# Patient Record
Sex: Female | Born: 1965 | Race: Black or African American | Hispanic: No | Marital: Married | Smoking: Never smoker
Health system: Southern US, Community
[De-identification: ages and names within clinical notes are randomized; demographics above are authoritative.]

## PROBLEM LIST (undated history)

## (undated) DIAGNOSIS — I10 Essential (primary) hypertension: Secondary | ICD-10-CM

## (undated) HISTORY — PX: APPENDECTOMY: SHX54

## (undated) HISTORY — PX: ABDOMINAL HYSTERECTOMY: SHX81

## (undated) HISTORY — PX: TUBAL LIGATION: SHX77

## (undated) HISTORY — PX: CHOLECYSTECTOMY: SHX55

---

## 2005-10-10 ENCOUNTER — Ambulatory Visit: Payer: Self-pay | Admitting: Family Medicine

## 2006-08-01 ENCOUNTER — Emergency Department (HOSPITAL_COMMUNITY): Admission: EM | Admit: 2006-08-01 | Discharge: 2006-08-01 | Payer: Self-pay | Admitting: Emergency Medicine

## 2006-08-06 ENCOUNTER — Ambulatory Visit (HOSPITAL_COMMUNITY): Admission: RE | Admit: 2006-08-06 | Discharge: 2006-08-06 | Payer: Self-pay | Admitting: General Surgery

## 2006-08-06 ENCOUNTER — Encounter (INDEPENDENT_AMBULATORY_CARE_PROVIDER_SITE_OTHER): Payer: Self-pay | Admitting: Specialist

## 2007-02-06 ENCOUNTER — Ambulatory Visit (HOSPITAL_COMMUNITY): Admission: RE | Admit: 2007-02-06 | Discharge: 2007-02-06 | Payer: Self-pay | Admitting: Obstetrics and Gynecology

## 2010-12-11 NOTE — Op Note (Signed)
Allison Hobbs, Allison Hobbs            ACCOUNT NO.:  [id number]   MEDICAL RECORD NO.:  [id number]          PATIENT TYPE:  AMB   LOCATION:  DAY                           FACILITY:  [facility]   PHYSICIAN:  Tilda Burrow, M.D. DATE OF BIRTH:  [birth year]   DATE OF PROCEDURE:  02/06/2007  DATE OF DISCHARGE:                               OPERATIVE REPORT   PREOPERATIVE DIAGNOSIS:  1. Elective sterilization.  2. Right lower quadrant abdominal wall scarring status post      appendectomy   POSTOPERATIVE DIAGNOSIS:  1. Elective sterilization.  2. Right lower quadrant abdominal wall scarring status post      appendectomy  3. Right uterine subserosal fibroid.   PROCEDURE:  1. Laparoscopic tubal sterilization with Falope rings.  2. Excision right lower quadrant abdominal scar with two-layer      closure.   SURGEON:  Christin Bach, M.D.   ASSISTANTMarlana Salvage, CST.   ANESTHESIA:  General.   COMPLICATIONS:  None.   FINDINGS:  1. A 3 cm right uterine subserosal fibroid located just in the area      where the round ligament attaches on the right.  2. Normal tubes and ovaries bilaterally.  3. A 4 cm long retracted scar in the right lower quadrant from      childhood appendectomy.   DETAILS OF PROCEDURE:  The patient was taken to the operating room,  prepped and draped for lower abdominal surgery with legs in low  lithotomy support and single-tooth tenaculum attached to the cervix with  in-and-out catheterization of the bladder.  Attention was first directed  to the right lower quadrant incision which was approximately 4 cm in  length and was retracted from old scarring.  A 4 cm long by 1 cm wide  ellipse of skin and fibrosis was excised and beneath it the skin was  elevated and the remaining subcutaneous fibrosis was cut on either side,  getting down to normal appearing fatty tissue on either side.  Bovie  cautery was used sparingly to achieve adequate hemostasis and then a  series of  subcuticular #4-0 Dexon sutures were placed to reapproximate  skin edges.  Some dog-earing at one end of the incision was trimmed with  Metzenbaum scissors.  Hemostasis was good.   Tubal sterilization was then performed and was notable for right uterine  subserosal fibroid 3 cm or so in size.  An infraumbilical vertical 1 cm  skin incision was made, as well as a suprapubic incision of similar  length.  The Veress needle was introduced using water droplet technique  to confirm location, pneumoperitoneum achieved under 7 mmHg pressure,  the procedure then allowed introduction of the laparoscopic trocar under  direct visualization.  A suprapubic 7 mm trocar was placed under direct  visualization.  Attention was directed to the left lower quadrant where  the Falope ring could be identified to the fimbriated end, elevated in a  mid segment knuckle of tube incarcerated in the Falope ring applier.  Photo documentation was performed though photo quality was poor.  The  knuckle of tube was then infiltrated percutaneously with  Marcaine  solution as well as the broad ligament.  The opposite tube was treated  in a similar fashion.  Deflation of the abdomen was followed by  instilling 120 mL of saline in the abdomen to help with evacuation of  intraabdominal carbon dioxide, then the incisions closed at the  umbilical and suprapubic site using subcuticular stitches as well.  Steri-Strips were placed on all 3 incisions and then Op-Site placed over  the right lower quadrant incision.  The patient went to Recovery Room in  good condition.  Sponge and needle counts correct.  EBL minimal.      Tilda Burrow, M.D.  Electronically Signed     JVF/MEDQ  D:  02/06/2007  T:  02/06/2007  Job:  [id number]   cc:   [facility]  [address]  [address]  [place], [place] [address]

## 2010-12-11 NOTE — H&P (Signed)
NAME:  Allison Hobbs, Allison Hobbs            ACCOUNT NO.:  [id number]   MEDICAL RECORD NO.:  [id number]          PATIENT TYPE:  AMB   LOCATION:                                FACILITY:  [facility]   PHYSICIAN:  Tilda Burrow, M.D. DATE OF BIRTH:  Oct 15, 1965   DATE OF ADMISSION:  DATE OF DISCHARGE:  LH                              HISTORY & PHYSICAL   ADMISSION DIAGNOSIS:  Desire for elective permanent sterilization, right  lower quadrant abdominal scar for revision ([place]).   HISTORY OF PRESENT ILLNESS:  This 45 year old female gravida 2, para 2,  status post routine care through the [facility] Department is  admitted at this time for elective permanent sterilization.  She has  been seen in our office and counseled over the permanency procedure with  technical aspects of the procedure reviewed using [employer] instructional  booklet as a visual guide.  The patient understands the permanency with  a failure rate of 1 in 200 quoted to the patient.  Currently on birth  control pills, Ortho-Novum 7/7/7, for three months.   PAST MEDICAL HISTORY:  Positive for Chlamydia in 2007 and last month as  well.  Recently treated.  Not currently sexually active for several  months. Medical history is otherwise negative.   PAST SURGICAL HISTORY:  Gallbladder removal in 2008 by Dr. Lovell Sheehan.  Appendectomy as a child.   INJURIES:  None.   ALLERGIES:  PENICILLIN.   MEDICATIONS:  None.   PHYSICAL EXAMINATION:  VITAL SIGNS:  Height 5 feet 5, weight 179.  HEENT:  Pupils are equal, round and reactive.  Extraocular movements are  intact.  NECK:  Supple.  CHEST:  Clear to auscultation.  ABDOMEN:  Nontender.  PELVIC:  External genitalia:  Multiparous female.  Good vaginal length.  Cervix easily visualized cervix.  The uterus is anteflexed and mobile.  The adnexa is nontender.  EXTREMITIES:  Grossly within normal limits.   PLAN:  1. LTS laparoscopic tubal sterilization Falope rings on Friday, the  11th.  2. 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      Tilda Burrow, M.D.  Electronically Signed     JVF/MEDQ  D:  01/20/2007  T:  01/20/2007  Job:  [id number]

## 2010-12-14 NOTE — Op Note (Signed)
[name], Allison Hobbs            ACCOUNT NO.:  [id number]   MEDICAL RECORD NO.:  [id number]          PATIENT TYPE:  AMB   LOCATION:  DAY                           FACILITY:  [facility]   PHYSICIAN:  Dalia Heading, M.D.  DATE OF BIRTH:  [birth year]   DATE OF PROCEDURE:  08/06/2006  DATE OF DISCHARGE:                               OPERATIVE REPORT   PREOPERATIVE DIAGNOSIS:  Cholecystitis, cholelithiasis.   POSTOPERATIVE DIAGNOSIS:  Cholecystitis, cholelithiasis.   PROCEDURE:  Laparoscopic cholecystectomy.   SURGEON:  Dalia Heading, MD   ANESTHESIA:  General endotracheal.   INDICATIONS:  The patient is a 45 year old black female who presents  with cholecystitis secondary to cholelithiasis.  The risks and benefits  of the procedure including bleeding, infection, hepatobiliary injury,  and the possibly of an open procedure, were fully explained to the  patient, who gave informed consent.   PROCEDURE NOTE:  The patient was placed in the supine position.  After  induction of general endotracheal anesthesia, the abdomen was prepped  and draped using the usual sterile technique with Betadine.  Surgical  site confirmation was performed.   A supraumbilical incision was made down to the fascia.  A Veress needle  was introduced into the abdominal cavity and confirmation of placement  was done using the saline drop test.  The abdomen was then insufflated  to 16 mmHg pressure.  An 11 mm trocar was introduced into the abdominal  cavity under direct visualization without difficulty.  The patient was  placed in reversed Trendelenburg position.  An additional 11-mm trocar  was placed in the epigastric region and 5-mm trocars were placed in the  right upper quadrant and right flank regions.  The liver was inspected  and noted to be within normal limits.  The gallbladder was retracted  superiorly and laterally.  The dissection was begun around the  infundibulum of the gallbladder.  The cystic  duct was first identified.  Its juncture to the infundibulum fully identified.  Endoclips were  placed proximally and distally on the cystic duct and the cystic duct  was divided.  This was likewise done to the cystic artery.  The  gallbladder was then freed away from the gallbladder fossa using Bovie  electrocautery.  The gallbladder was delivered through the epigastric  trocar site using an EndoCatch bag.  The gallbladder fossa was inspected  and no abnormal bleeding or bile leakage was noted.  Surgicel was placed  in the gallbladder fossa.  All fluid and air were then evacuated from  the abdominal cavity prior to removal of the trocars.   All wounds were irrigated with normal saline.  All wounds were injected  with 0.5% Sensorcaine.  The supraumbilical fascia was reapproximated  using an 0 Vicryl interrupted suture.  All skin incisions were closed  using staples.  Betadine ointment and dry sterile dressings were  applied.   All tape and needle counts were correct at the end of the procedure.  The patient was extubated in the operating room and went back to the  recovery room awake, in stable condition.   COMPLICATIONS:  None.   SPECIMEN:  Gallbladder.   BLOOD LOSS:  Minimal.      Dalia Heading, M.D.  Electronically Signed     MAJ/MEDQ  D:  08/06/2006  T:  08/06/2006  Job:  [id number]

## 2010-12-14 NOTE — H&P (Signed)
NAME:  Allison Hobbs, Allison Hobbs            ACCOUNT NO.:  [id number]   MEDICAL RECORD NO.:  [id number]          PATIENT TYPE:  AMB   LOCATION:  DAY                           FACILITY:  [facility]   PHYSICIAN:  Dalia Heading, M.D.  DATE OF BIRTH:  07-29-1966   DATE OF ADMISSION:  DATE OF DISCHARGE:  LH                              HISTORY & PHYSICAL   CHIEF COMPLAINT:  Cholecystitis, cholelithiasis.   HISTORY OF PRESENT ILLNESS:  The patient is a 45 year old black female  who is referred for evaluation and treatment of biliary colic secondary  to cholelithiasis.  She has been having right upper quadrant abdominal  pain, nausea, bloating for over a week.  No resolution has been noted.  She does have fatty food intolerance.  No fever, chills, or jaundice  have been noted.   PAST MEDICAL HISTORY:  Unremarkable.   PAST SURGICAL HISTORY:  Appendectomy in the remote past.   CURRENT MEDICATIONS:  Bactrim for presumed a UTI, Vicodin as needed for  pain.   ALLERGIES:  PENICILLIN.   REVIEW OF SYSTEMS:  Noncontributory.   PHYSICAL EXAMINATION:  GENERAL:  The patient is a well-developed, well-  nourished black female in no acute distress.  HEENT:  Reveals no scleral icterus.  LUNGS:  Clear to auscultation with equal breath sounds bilaterally.  HEART:  Reveals a regular rate and rhythm without S3, S4, or murmurs.  ABDOMEN:  Soft and nondistended.  She is tender in the right upper  quadrant to palpation.  No hepatosplenomegaly, masses, hernias are  identified.   Ultrasound of the gallbladder reveals cholelithiasis with a normal  common bile duct.   IMPRESSION:  1. Cholecystitis.  2. Cholelithiasis.   PLAN:  The patient is scheduled for a laparoscopic cholecystectomy on  August 06, 2006.  The risks and benefits of the procedure including  bleeding, infection, hepatobiliary injury, and the possibly an open  procedure were fully explained to the patient, gave informed consent.      Dalia Heading, M.D.  Electronically Signed     MAJ/MEDQ  D:  08/05/2006  T:  08/05/2006  Job:  [id number]   cc:   [facility]  Fax: [phone]

## 2011-05-14 LAB — CBC
HCT: 34.6 — ABNORMAL LOW
MCHC: 33.6
Platelets: 328
RDW: 13.9
WBC: 7.6

## 2011-05-14 LAB — DIFFERENTIAL
Lymphocytes Relative: 46
Lymphs Abs: 3.5 — ABNORMAL HIGH

## 2011-09-18 ENCOUNTER — Emergency Department: Payer: Self-pay | Admitting: Emergency Medicine

## 2012-09-18 ENCOUNTER — Emergency Department: Payer: Self-pay | Admitting: Emergency Medicine

## 2012-09-18 LAB — COMPREHENSIVE METABOLIC PANEL
Alkaline Phosphatase: 85 U/L (ref 50–136)
Bilirubin,Total: 0.4 mg/dL (ref 0.2–1.0)
Calcium, Total: 8.8 mg/dL (ref 8.5–10.1)
Chloride: 106 mmol/L (ref 98–107)
EGFR (African American): >60
EGFR (Non-African Amer.): >60
Osmolality: 276 (ref 275–301)
Potassium: 4.1 mmol/L (ref 3.5–5.1)
SGOT(AST): 28 U/L (ref 15–37)
Total Protein: 8 g/dL (ref 6.4–8.2)

## 2012-09-18 LAB — CBC
HCT: 25.5 % — ABNORMAL LOW (ref 35.0–47.0)
HGB: 7.5 g/dL — ABNORMAL LOW (ref 12.0–16.0)
MCH: 18.5 pg — ABNORMAL LOW (ref 26.0–34.0)
MCHC: 29.5 g/dL — ABNORMAL LOW (ref 32.0–36.0)
Platelet: 518 10*3/uL — ABNORMAL HIGH (ref 150–440)
RDW: 19.9 % — ABNORMAL HIGH (ref 11.5–14.5)
WBC: 8.3 10*3/uL (ref 3.6–11.0)

## 2012-09-28 ENCOUNTER — Ambulatory Visit: Payer: Self-pay | Admitting: Obstetrics & Gynecology

## 2012-09-28 LAB — PREGNANCY, URINE: Pregnancy Test, Urine: NEGATIVE m[IU]/mL

## 2012-09-28 LAB — CBC
MCH: 18.6 pg — ABNORMAL LOW (ref 26.0–34.0)
MCHC: 28.7 g/dL — ABNORMAL LOW (ref 32.0–36.0)
Platelet: 448 10*3/uL — ABNORMAL HIGH (ref 150–440)
RBC: 4.42 10*6/uL (ref 3.80–5.20)
WBC: 8.1 10*3/uL (ref 3.6–11.0)

## 2012-10-09 ENCOUNTER — Inpatient Hospital Stay: Payer: Self-pay | Admitting: Obstetrics & Gynecology

## 2012-11-10 ENCOUNTER — Ambulatory Visit: Payer: Self-pay

## 2014-01-14 ENCOUNTER — Emergency Department: Payer: Self-pay | Admitting: Emergency Medicine

## 2014-01-14 LAB — BASIC METABOLIC PANEL
Anion Gap: 4 — ABNORMAL LOW (ref 7–16)
BUN: 8 mg/dL (ref 7–18)
CHLORIDE: 105 mmol/L (ref 98–107)
Calcium, Total: 8.8 mg/dL (ref 8.5–10.1)
Co2: 28 mmol/L (ref 21–32)
Creatinine: 0.78 mg/dL (ref 0.60–1.30)
EGFR (African American): >60
EGFR (Non-African Amer.): >60
Glucose: 87 mg/dL (ref 65–99)
Osmolality: 272 (ref 275–301)
Potassium: 3.8 mmol/L (ref 3.5–5.1)
Sodium: 137 mmol/L (ref 136–145)

## 2014-01-14 LAB — CBC
HCT: 35.8 % (ref 35.0–47.0)
HGB: 11.7 g/dL — AB (ref 12.0–16.0)
MCH: 27.6 pg (ref 26.0–34.0)
MCHC: 32.7 g/dL (ref 32.0–36.0)
MCV: 85 fL (ref 80–100)
Platelet: 266 10*3/uL (ref 150–440)
RBC: 4.23 10*6/uL (ref 3.80–5.20)
RDW: 15.6 % — ABNORMAL HIGH (ref 11.5–14.5)
WBC: 6.2 10*3/uL (ref 3.6–11.0)

## 2014-01-14 LAB — TROPONIN I: Troponin-I: <0.02 ng/mL

## 2014-05-25 ENCOUNTER — Ambulatory Visit: Payer: Self-pay | Admitting: Physician Assistant

## 2014-11-01 ENCOUNTER — Ambulatory Visit
Admit: 2014-11-01 | Disposition: A | Discharge status: Home / Self Care | Payer: Self-pay | Admitting: Physician Assistant

## 2014-11-18 NOTE — Op Note (Signed)
PATIENT NAME:  Allison Hobbs, Allison Hobbs MR#:  [id number] DATE OF BIRTH:  10-Feb-1966  DATE OF PROCEDURE:  10/09/2012  PREOPERATIVE DIAGNOSIS: Fibroid uterus and anemia.  POSTOPERATIVE DIAGNOSIS: Fibroid uterus and anemia.   PROCEDURE: Total abdominal hysterectomy.   SURGEON: Annamarie MajorPaul Harris, M.D.   ASSISTANT: Vena AustriaAndreas Staebler, M.D. M.D.   ANESTHESIA: General.   ESTIMATED BLOOD LOSS: 150 mL.   COMPLICATIONS: None.   FINDINGS: Large fibroid uterus and small normal ovaries.   DISPOSITION: To the recovery room in stable condition.   TECHNIQUE: The patient is prepped and draped in the usual sterile fashion after adequate anesthesia is obtained in the supine position on the operating room table. Marcaine 1% is used to infiltrate the skin in the subcuticular space and the subcutaneous space. A midline vertical skin incision is created with a scalpel inferior to the umbilicus down to this suprapubic region. The incision was carried down to the level of the rectus fascia. The rectus fascia was incised and dissected carefully superiorly and inferiorly using Mayo scissors. The muscles are separated in the midline. The peritoneum is penetrated. Uterus is and immediately visualized as fibroid and enlarged in the intra-abdominal cavity.   The right and left round ligaments are carefully clamped, suture ligated and transected. The uterine ovarian blood vessels and ligaments are clamped, transected, and suture ligated with preservation of the ovaries and their main blood supply. Dissection is carried out at this point internally to free up the uterus from its lateral attachments. The uterus is then delivered through the abdominal incision without difficulty. Clamps are applied along the broad ligament complex just off of the uterine wall to the level of the uterine arteries. These are dissected with clamping and suture ligating to minimize bleeding. The ureters are out of harm'Hobbs way during this part of the procedure.  The uterine arteries are then clamped, transected, and suture ligated twice. The uterus is then amputated for ease of visualization. The cervix is grasped with a tenaculum and is further dissected for complete amputation. Clamps are placed along the lateral sides of the cervix down to the level of the external os with transection and suture ligation of these artery pedicles. The cervix is then amputated across the vaginal cuff and the vaginal cuff is closed with interrupted 0 Vicryl sutures until complete closure is performed. Irrigation of the pelvic cavity is performed with aspiration of fluid. No bleeding is noted. No injuries to bladder, bowel or ureters are observed.   The rectus fascia is then closed with a Prolene suture in a running locking fashion. The subcutaneous tissues are irrigated and hemostasis is assured using electrocautery. Skin is closed with surgical clips and a sterile bandage is applied. All sponge, instrument, and needle counts are correct. The patient tolerated procedure well and goes to recovery room in stable condition.   ____________________________ R. Annamarie MajorPaul Harris, MD rph:aw D: 10/09/2012 10:47:32 ET T: 10/09/2012 11:07:06 ET JOB#: [id number]  cc: Dierdre Searles. Paul Harris, MD, <Dictator> Nadara MustardOBERT P HARRIS MD ELECTRONICALLY SIGNED 10/10/2012 9:43

## 2015-06-20 ENCOUNTER — Other Ambulatory Visit: Payer: Self-pay | Admitting: Physician Assistant

## 2015-06-20 DIAGNOSIS — Z1231 Encounter for screening mammogram for malignant neoplasm of breast: Secondary | ICD-10-CM

## 2015-07-06 ENCOUNTER — Ambulatory Visit
Admission: RE | Admit: 2015-07-06 | Discharge: 2015-07-06 | Disposition: A | Discharge status: Home / Self Care | Payer: No Typology Code available for payment source | Source: Ambulatory Visit | Attending: Physician Assistant | Admitting: Physician Assistant

## 2015-07-06 DIAGNOSIS — Z1231 Encounter for screening mammogram for malignant neoplasm of breast: Secondary | ICD-10-CM | POA: Diagnosis present

## 2016-04-26 ENCOUNTER — Other Ambulatory Visit: Payer: Self-pay | Admitting: Pediatrics

## 2016-04-26 DIAGNOSIS — Z1231 Encounter for screening mammogram for malignant neoplasm of breast: Secondary | ICD-10-CM

## 2016-07-09 ENCOUNTER — Ambulatory Visit: Admission: RE | Admit: 2016-07-09 | Payer: No Typology Code available for payment source | Source: Ambulatory Visit

## 2016-07-09 ENCOUNTER — Ambulatory Visit
Admission: RE | Admit: 2016-07-09 | Discharge: 2016-07-09 | Disposition: A | Discharge status: Home / Self Care | Payer: BLUE CROSS/BLUE SHIELD | Source: Ambulatory Visit | Attending: Pediatrics | Admitting: Pediatrics

## 2016-07-09 DIAGNOSIS — Z1231 Encounter for screening mammogram for malignant neoplasm of breast: Secondary | ICD-10-CM | POA: Diagnosis present

## 2016-12-18 ENCOUNTER — Other Ambulatory Visit: Payer: Self-pay | Admitting: Orthopedic Surgery

## 2016-12-18 DIAGNOSIS — G8929 Other chronic pain: Secondary | ICD-10-CM

## 2016-12-18 DIAGNOSIS — M25561 Pain in right knee: Principal | ICD-10-CM

## 2016-12-18 DIAGNOSIS — M2391 Unspecified internal derangement of right knee: Secondary | ICD-10-CM

## 2016-12-27 ENCOUNTER — Ambulatory Visit
Admission: RE | Admit: 2016-12-27 | Discharge: 2016-12-27 | Disposition: A | Discharge status: Home / Self Care | Payer: BLUE CROSS/BLUE SHIELD | Source: Ambulatory Visit | Attending: Orthopedic Surgery | Admitting: Orthopedic Surgery

## 2016-12-27 DIAGNOSIS — M2391 Unspecified internal derangement of right knee: Secondary | ICD-10-CM

## 2016-12-27 DIAGNOSIS — M67863 Other specified disorders of tendon, right knee: Secondary | ICD-10-CM | POA: Insufficient documentation

## 2016-12-27 DIAGNOSIS — G8929 Other chronic pain: Secondary | ICD-10-CM | POA: Diagnosis not present

## 2016-12-27 DIAGNOSIS — M25561 Pain in right knee: Secondary | ICD-10-CM | POA: Diagnosis not present

## 2016-12-31 ENCOUNTER — Encounter: Payer: Self-pay | Admitting: Emergency Medicine

## 2016-12-31 ENCOUNTER — Emergency Department: Payer: BLUE CROSS/BLUE SHIELD

## 2016-12-31 ENCOUNTER — Emergency Department
Admission: EM | Admit: 2016-12-31 | Discharge: 2016-12-31 | Disposition: A | Discharge status: Home / Self Care | Payer: BLUE CROSS/BLUE SHIELD | Attending: Emergency Medicine | Admitting: Emergency Medicine

## 2016-12-31 DIAGNOSIS — R42 Dizziness and giddiness: Secondary | ICD-10-CM | POA: Insufficient documentation

## 2016-12-31 DIAGNOSIS — I10 Essential (primary) hypertension: Secondary | ICD-10-CM | POA: Insufficient documentation

## 2016-12-31 DIAGNOSIS — Z79899 Other long term (current) drug therapy: Secondary | ICD-10-CM | POA: Insufficient documentation

## 2016-12-31 HISTORY — DX: Essential (primary) hypertension: I10

## 2016-12-31 LAB — CBC
HCT: 41.2 % (ref 35.0–47.0)
Hemoglobin: 13.7 g/dL (ref 12.0–16.0)
MCH: 28.8 pg (ref 26.0–34.0)
MCHC: 33.3 g/dL (ref 32.0–36.0)
MCV: 86.4 fL (ref 80.0–100.0)
PLATELETS: 267 10*3/uL (ref 150–440)
RBC: 4.76 MIL/uL (ref 3.80–5.20)
RDW: 14.2 % (ref 11.5–14.5)
WBC: 6.6 10*3/uL (ref 3.6–11.0)

## 2016-12-31 LAB — BASIC METABOLIC PANEL
Anion gap: 7 (ref 5–15)
BUN: 10 mg/dL (ref 6–20)
CALCIUM: 9 mg/dL (ref 8.9–10.3)
CHLORIDE: 106 mmol/L (ref 101–111)
CO2: 27 mmol/L (ref 22–32)
CREATININE: 0.59 mg/dL (ref 0.44–1.00)
GFR calc non Af Amer: >60 mL/min (ref >60)
Glucose, Bld: 102 mg/dL — ABNORMAL HIGH (ref 65–99)
Potassium: 4 mmol/L (ref 3.5–5.1)
SODIUM: 140 mmol/L (ref 135–145)

## 2016-12-31 LAB — URINALYSIS, COMPLETE (UACMP) WITH MICROSCOPIC
BILIRUBIN URINE: NEGATIVE
Bacteria, UA: NONE SEEN
GLUCOSE, UA: NEGATIVE mg/dL
Ketones, ur: NEGATIVE mg/dL
LEUKOCYTES UA: NEGATIVE
Nitrite: NEGATIVE
PH: 7 (ref 5.0–8.0)
Protein, ur: NEGATIVE mg/dL
SPECIFIC GRAVITY, URINE: 1.006 (ref 1.005–1.030)

## 2016-12-31 LAB — TROPONIN I

## 2016-12-31 MED ORDER — ONDANSETRON HCL 4 MG PO TABS: 4.0000 mg | ORAL_TABLET | Freq: Three times a day (TID) | ORAL | 0 refills | Status: DC | PRN

## 2016-12-31 MED ORDER — SODIUM CHLORIDE 0.9 % IV BOLUS (SEPSIS)
1000.0000 mL | Freq: Once | INTRAVENOUS | Status: AC
  Administered 2016-12-31: 1000 mL via INTRAVENOUS

## 2016-12-31 MED ORDER — MECLIZINE HCL 25 MG PO TABS: 25.0000 mg | ORAL_TABLET | Freq: Three times a day (TID) | ORAL | 0 refills | Status: DC | PRN

## 2016-12-31 MED ORDER — IOPAMIDOL (ISOVUE-370) INJECTION 76%
75.0000 mL | Freq: Once | INTRAVENOUS | Status: AC | PRN
  Administered 2016-12-31: 75 mL via INTRAVENOUS

## 2016-12-31 MED ORDER — MECLIZINE HCL 25 MG PO TABS
25.0000 mg | ORAL_TABLET | Freq: Once | ORAL | Status: AC
  Administered 2016-12-31: 25 mg via ORAL
  Filled 2016-12-31: qty 1

## 2016-12-31 NOTE — ED Notes (Signed)
Lab contacted in regards to troponin add on, states they are short staffed this morning, will run troponin now.

## 2016-12-31 NOTE — ED Notes (Signed)
Esign not working, pt verbalized discharge instructions and has no questions at this time 

## 2016-12-31 NOTE — ED Provider Notes (Signed)
[facility] Emergency Department Provider Note   ____________________________________________   First MD Initiated Contact with Patient 12/31/16 [phone]     (approximate)  I have reviewed the triage vital signs and the nursing notes.   HISTORY  Chief Complaint Dizziness    HPI Allison Hobbs is a 51 y.o. female who comes into the hospital today with dizziness. The patient has felt dizzy for the past couple of days. She was getting ready for work today when she sat up on the bed and felt more dizzy and very wobbly. She got a blood pressure might be abscessed at some vinegar. She denies any other illnesses but did have some back pain yesterday. The patient stopped taking her blood pressure meds about a month ago but reports that her doctor was okay with it because the last time they checked her blood pressure was okay. The patient reports that she's been lightheaded and felt wobbly to the left. The patient is been eating and drinking well but stated she had some left arm tingling. The patient denies any chest pain or shortness of breath that she's been very nauseous. The patient decided to come into the hospital today for evaluation of these symptoms.   Past Medical History:  Diagnosis Date  . Hypertension     There are no active problems to display for this patient.   Past Surgical History:  Procedure Laterality Date  . ABDOMINAL HYSTERECTOMY      Prior to Admission medications   Not on File    Allergies Penicillins  Family History  Problem Relation Age of Onset  . Breast cancer Neg Hx     Social History Social History  Substance Use Topics  . Smoking status: Never Smoker  . Smokeless tobacco: Never Used  . Alcohol use Not on file    Review of Systems  Constitutional: No fever/chills Eyes: No visual changes. ENT: No sore throat. Cardiovascular: Denies chest pain. Respiratory: Denies shortness of breath. Gastrointestinal: Nausea  with No abdominal pain.  no vomiting.  No diarrhea.  No constipation. Genitourinary: Negative for dysuria. Musculoskeletal: Negative for back pain. Skin: Negative for rash. Neurological: Dizziness   ____________________________________________   PHYSICAL EXAM:  VITAL SIGNS: ED Triage Vitals  Enc Vitals Group     BP 12/31/16 0538 (!) 159/82     Pulse Rate 12/31/16 0538 62     Resp 12/31/16 0538 18     Temp 12/31/16 0538 98 F (36.7 C)     Temp Source 12/31/16 0538 Oral     SpO2 12/31/16 0538 100 %     Weight 12/31/16 0533 186 lb (84.4 kg)     Height 12/31/16 0533 5\' 5"  (1.651 m)     Head Circumference --      Peak Flow --      Pain Score --      Pain Loc --      Pain Edu? --      Excl. in GC? --     Constitutional: Alert and oriented. Well appearing and in Mild distress. Eyes: Conjunctivae are normal. PERRL. EOMI. Head: Atraumatic. Nose: No congestion/rhinnorhea. Mouth/Throat: Mucous membranes are moist.  Oropharynx non-erythematous. Cardiovascular: Normal rate, regular rhythm. Grossly normal heart sounds.  Good peripheral circulation. Respiratory: Normal respiratory effort.  No retractions. Lungs CTAB. Gastrointestinal: Soft and nontender. No distention. Positive bowel sounds Musculoskeletal: No lower extremity tenderness nor edema.   Neurologic:  Normal speech and language. Cranial nerves II through XII grossly intact  with no focal motor or neuro deficit, finger to nose intact, sensation intact throughout. Skin:  Skin is warm, dry and intact. No rash noted. Psychiatric: Mood and affect are normal.   ____________________________________________   LABS (all labs ordered are listed, but only abnormal results are displayed)  Labs Reviewed  BASIC METABOLIC PANEL - Abnormal; Notable for the following:       Result Value   Glucose, Bld 102 (*)    All other components within normal limits  CBC  URINALYSIS, COMPLETE (UACMP) WITH MICROSCOPIC    ____________________________________________  EKG  ED ECG REPORT I, Rebecka Apley, the attending physician, personally viewed and interpreted this ECG.   Date: 12/31/2016  EKG Time: 543  Rate: 60  Rhythm: sinus rhythm  Axis: normal  Intervals:none  ST&T Change: none  ____________________________________________  RADIOLOGY  No results found.  ____________________________________________   PROCEDURES  Procedure(s) performed: None  Procedures  Critical Care performed: No  ____________________________________________   INITIAL IMPRESSION / ASSESSMENT AND PLAN / ED COURSE  Pertinent labs & imaging results that were available during my care of the patient were reviewed by me and considered in my medical decision making (see chart for details).  This is a 51 year old female who comes into the hospital today with dizziness. The patient will receive a CT scan as well as some blood work. I will give the patient a liter of normal saline and she will have again a CTA of her head and neck. The patient's care was signed out to Dr. Romero Liner follow-up the results of the CT angio and reassess the patient.      ____________________________________________   FINAL CLINICAL IMPRESSION(S) / ED DIAGNOSES  Final diagnoses:  Dizziness      NEW MEDICATIONS STARTED DURING THIS VISIT:  New Prescriptions   No medications on file     Note:  This document was prepared using Dragon voice recognition software and may include unintentional dictation errors.    Rebecka Apley, MD 12/31/16 0730

## 2016-12-31 NOTE — ED Notes (Signed)
Pt back from MRI at this time

## 2016-12-31 NOTE — Discharge Instructions (Addendum)
Please follow up with your primary care physician.

## 2016-12-31 NOTE — ED Notes (Signed)
Patient transported to MRI 

## 2016-12-31 NOTE — ED Provider Notes (Addendum)
-----------------------------------------   10:13 AM on 12/31/2016 -----------------------------------------  Suicidality pending troponin, patient has been feeling lightheaded and "dizzy" for 3 days and she feels like she is leaning to the left. Neurologic exam is normal, Cranial nerves II through XII are grossly intact 5 out of 5 strength bilateral upper and lower extremity. Finger to nose within normal limits heel to shin within normal limits, speech is normal with no word finding difficulty or dysarthria, reflexes symmetric, pupils are equally round and reactive to light, there is no pronator drift, sensation is normal, vision is intact to confrontation, gait is deferred, there is no nystagmus, normal neurologic exam, she is not orthostatic, but she still persists in feeling very "dizzy". This is somewhat difficult to tease out. CT CTA is negative. I will do an MRI to make sure that there is been no posterior circulation CVA causing her symptoms as that is negative is my believe that we can safely get her home according to Dr. Leone PayorWebster's plan.  ----------------------------------------- 12:42 PM on 12/31/2016 -----------------------------------------  MRI is negative, neuro exam remains normal, she has no chest pain she has no shortness of breath, cardiac enzymes are negative despite 3 days of symptoms, she does not have an obvious urinary tract infection, CBC and electrolytes are reassuring, vital signs are reassuring she is not orthostatic to any significant degree, unclear why the patient feels dizzy. We will send her home with Antivert and nausea medication we'll have her closely follow up with her primary care doctor and neurology   Jeanmarie PlantMcShane, James A, MD 12/31/16 1014    Jeanmarie PlantMcShane, James A, MD 12/31/16 1242

## 2016-12-31 NOTE — ED Triage Notes (Signed)
Patient ambulatory to triage with steady gait, without difficulty or distress noted; pt reports awoke with dizziness and nausea; denies any recent illness, denies hx of same; st stopped taking her BP meds due to SE(s) and concerned that her BP may be up

## 2016-12-31 NOTE — ED Notes (Signed)
Patient transported to CT 

## 2017-05-09 ENCOUNTER — Other Ambulatory Visit: Payer: Self-pay | Admitting: Pediatrics

## 2017-05-09 DIAGNOSIS — Z1231 Encounter for screening mammogram for malignant neoplasm of breast: Secondary | ICD-10-CM

## 2017-05-26 ENCOUNTER — Ambulatory Visit
Admission: RE | Admit: 2017-05-26 | Discharge: 2017-05-26 | Disposition: A | Discharge status: Home / Self Care | Payer: BLUE CROSS/BLUE SHIELD | Source: Ambulatory Visit | Attending: Pediatrics | Admitting: Pediatrics

## 2017-05-26 DIAGNOSIS — Z1231 Encounter for screening mammogram for malignant neoplasm of breast: Secondary | ICD-10-CM | POA: Insufficient documentation

## 2018-02-18 ENCOUNTER — Other Ambulatory Visit: Payer: Self-pay | Admitting: Pediatrics

## 2018-02-18 DIAGNOSIS — Z1231 Encounter for screening mammogram for malignant neoplasm of breast: Secondary | ICD-10-CM

## 2018-02-23 ENCOUNTER — Emergency Department: Payer: BLUE CROSS/BLUE SHIELD

## 2018-02-23 ENCOUNTER — Emergency Department
Admission: EM | Admit: 2018-02-23 | Discharge: 2018-02-23 | Disposition: A | Discharge status: Home / Self Care | Payer: BLUE CROSS/BLUE SHIELD | Attending: Emergency Medicine | Admitting: Emergency Medicine

## 2018-02-23 ENCOUNTER — Encounter: Payer: Self-pay | Admitting: Emergency Medicine

## 2018-02-23 DIAGNOSIS — Z79899 Other long term (current) drug therapy: Secondary | ICD-10-CM | POA: Insufficient documentation

## 2018-02-23 DIAGNOSIS — M5431 Sciatica, right side: Secondary | ICD-10-CM | POA: Diagnosis not present

## 2018-02-23 DIAGNOSIS — M25561 Pain in right knee: Secondary | ICD-10-CM | POA: Diagnosis not present

## 2018-02-23 DIAGNOSIS — I10 Essential (primary) hypertension: Secondary | ICD-10-CM | POA: Insufficient documentation

## 2018-02-23 DIAGNOSIS — R200 Anesthesia of skin: Secondary | ICD-10-CM | POA: Diagnosis present

## 2018-02-23 MED ORDER — METHYLPREDNISOLONE 4 MG PO TBPK: ORAL_TABLET | ORAL | 0 refills | Status: DC

## 2018-02-23 MED ORDER — CYCLOBENZAPRINE HCL 10 MG PO TABS: 10.0000 mg | ORAL_TABLET | Freq: Three times a day (TID) | ORAL | 0 refills | Status: DC | PRN

## 2018-02-23 NOTE — ED Triage Notes (Signed)
Patient presents to the ED with numbness down her right leg x 2 months.  Patient is also complaining of lower back pain and knee pain that she has had for years.  Patient states she was diagnosed with bulging discs in 2013.  Patient states, "I was told it would probably get better, but it's getting worse."  Patient is in no obvious distress at this time.  Patient states she had an MRI of her right knee last year that did not show "anything."

## 2018-02-23 NOTE — ED Notes (Signed)
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

## 2018-02-23 NOTE — ED Provider Notes (Signed)
[facility] Emergency Department Provider Note   ____________________________________________   First MD Initiated Contact with Patient 02/23/18 1040     (approximate)  I have reviewed the triage vital signs and the nursing notes.   HISTORY  Chief Complaint Leg Pain    HPI Allison Hobbs is a 52 y.o. female patient complain of radicular problems to her right lower extremity for 2 months.  Patient has not discussed this with her PCP.  Patient is concerned because she was diagnosed with bulging disc in 2018.  Patient also complained of right knee pain.  Patient had an MRI of the right knee last year was unremarkable.  Patient states she was seen by orthopedics last year for her knee and was told her that her back should get better.  Patient has not discussed this with treating orthopedics.  Patient stated radicular component "numbness" increases with prolonged standing or laying down.  Patient denies bladder bowel dysfunction.  Patient rates her pain discomfort as 8/10.  Patient's  chief complaint is "numbness".  No palliative measures for complaint.  Past Medical History:  Diagnosis Date  . Hypertension     There are no active problems to display for this patient.   Past Surgical History:  Procedure Laterality Date  . ABDOMINAL HYSTERECTOMY      Prior to Admission medications   Medication Sig Start Date End Date Taking? Authorizing Provider  hydrochlorothiazide (HYDRODIURIL) 25 MG tablet Take 25 mg by mouth daily.   Yes [provider]  cloNIDine (CATAPRES) 0.1 MG tablet Take 0.1 mg by mouth 2 (two) times daily. 09/13/16 09/13/17  [provider]  cyclobenzaprine (FLEXERIL) 10 MG tablet Take 1 tablet (10 mg total) by mouth 3 (three) times daily as needed. 02/23/18   Joni ReiningSmith, Ronald K, PA-C  Diclofenac Sodium CR 100 MG 24 hr tablet Take 100 mg by mouth daily. 12/24/16   [provider]  meclizine (ANTIVERT) 25 MG tablet Take 1  tablet (25 mg total) by mouth 3 (three) times daily as needed. 12/31/16   Jeanmarie PlantMcShane, James A, MD  methylPREDNISolone (MEDROL DOSEPAK) 4 MG TBPK tablet Take Tapered dose as directed 02/23/18   Joni ReiningSmith, Ronald K, PA-C  ondansetron (ZOFRAN) 4 MG tablet Take 1 tablet (4 mg total) by mouth every 8 (eight) hours as needed for nausea or vomiting. 12/31/16   Jeanmarie PlantMcShane, James A, MD  tiZANidine (ZANAFLEX) 4 MG tablet Take 4 mg by mouth at bedtime as needed. 12/10/16   [provider]    Allergies Penicillins  Family History  Problem Relation Age of Onset  . Breast cancer Neg Hx     Social History Social History   Tobacco Use  . Smoking status: Never Smoker  . Smokeless tobacco: Never Used  Substance Use Topics  . Alcohol use: Not on file  . Drug use: Not on file    Review of Systems Constitutional: No fever/chills Eyes: No visual changes. ENT: No sore throat. Cardiovascular: Denies chest pain. Respiratory: Denies shortness of breath. Gastrointestinal: No abdominal pain.  No nausea, no vomiting.  No diarrhea.  No constipation. Genitourinary: Negative for dysuria. Musculoskeletal: Negative for back pain. Skin: Negative for rash. Neurological: Negative for headaches, focal weakness or numbness. Allergic/Immunilogical: Penicillin ____________________________________________   PHYSICAL EXAM:  VITAL SIGNS: ED Triage Vitals  Enc Vitals Group     BP 02/23/18 1018 120/85     Pulse Rate 02/23/18 1018 81     Resp 02/23/18 1018 18  Temp 02/23/18 1018 98.7 F (37.1 C)     Temp Source 02/23/18 1018 Oral     SpO2 02/23/18 1018 97 %     Weight 02/23/18 1019 186 lb (84.4 kg)     Height 02/23/18 1019 5\' 5"  (1.651 m)     Head Circumference --      Peak Flow --      Pain Score 02/23/18 1018 8     Pain Loc --      Pain Edu? --      Excl. in GC? --    Constitutional: Alert and oriented. Well appearing and in no acute distress. Cardiovascular: Normal rate, regular rhythm. Grossly normal  heart sounds.  Good peripheral circulation. Respiratory: Normal respiratory effort.  No retractions. Lungs CTAB. Gastrointestinal: Soft and nontender. No distention. No abdominal bruits. No CVA tenderness. Musculoskeletal: No obvious spinal deformity.  Patient has Neurologic:  Normal speech and language. No gross focal neurologic deficits are appreciated. No gait instability. Skin:  Skin is warm, dry and intact. No rash noted. Psychiatric: Mood and affect are normal. Speech and behavior are normal.  ____________________________________________   LABS (all labs ordered are listed, but only abnormal results are displayed)  Labs Reviewed - No data to display ____________________________________________  EKG   ____________________________________________  RADIOLOGY  ED MD interpretation:    Official radiology report(s): Dg Lumbar Spine Complete  Result Date: 02/23/2018 CLINICAL DATA:  52 year old female with lower back pain for 6 years. Progressively worse. No injury. Initial encounter. EXAM: LUMBAR SPINE - COMPLETE 4+ VIEW COMPARISON:  None. FINDINGS: Moderate L5-S1 disc space narrowing. Minimal anterior slip L5 secondary to facet degenerative changes. No pars defect noted. Mild L3-4 and L4-5 disc space narrowing. No compression fracture. Sacroiliac joints intact. IMPRESSION: 1. Moderate L5-S1 disc space narrowing. Minimal anterior slip L5 secondary to facet degenerative changes. 2. Mild L3-4 and L4-5 disc space narrowing. Electronically Signed   By: Lacy Duverney M.D.   On: 02/23/2018 11:11    ____________________________________________   PROCEDURES  Procedure(s) performed: None  Procedures  Critical Care performed: No  ____________________________________________   INITIAL IMPRESSION / ASSESSMENT AND PLAN / ED COURSE  As part of my medical decision making, I reviewed the following data within the electronic MEDICAL RECORD NUMBER    Right leg numbness and right knee pain.   Discussed x-ray findings with patient showing disc space narrowing team and arthritic changes.  Patient given discharge care instruction advised follow orthopedic for definitive evaluation and treatment.      ____________________________________________   FINAL CLINICAL IMPRESSION(S) / ED DIAGNOSES  Final diagnoses:  Sciatica of right side  Acute pain of right knee     ED Discharge Orders        Ordered    methylPREDNISolone (MEDROL DOSEPAK) 4 MG TBPK tablet     02/23/18 1129    cyclobenzaprine (FLEXERIL) 10 MG tablet  3 times daily PRN     02/23/18 1129       Note:  This document was prepared using Dragon voice recognition software and may include unintentional dictation errors.    Joni Reining, PA-C 02/23/18 1134    Schaevitz, Myra Rude, MD 02/23/18 1524

## 2018-02-23 NOTE — Discharge Instructions (Signed)
Take medication as directed and follow orthopedic for definitive evaluation and treatment.

## 2018-05-10 ENCOUNTER — Emergency Department: Payer: BLUE CROSS/BLUE SHIELD

## 2018-05-10 ENCOUNTER — Other Ambulatory Visit: Payer: Self-pay

## 2018-05-10 ENCOUNTER — Emergency Department
Admission: EM | Admit: 2018-05-10 | Discharge: 2018-05-10 | Discharge status: Against Medical Advice | Payer: BLUE CROSS/BLUE SHIELD | Attending: Emergency Medicine | Admitting: Emergency Medicine

## 2018-05-10 DIAGNOSIS — Z5320 Procedure and treatment not carried out because of patient's decision for unspecified reasons: Secondary | ICD-10-CM | POA: Insufficient documentation

## 2018-05-10 DIAGNOSIS — I10 Essential (primary) hypertension: Secondary | ICD-10-CM | POA: Diagnosis not present

## 2018-05-10 DIAGNOSIS — M25521 Pain in right elbow: Secondary | ICD-10-CM | POA: Insufficient documentation

## 2018-05-10 DIAGNOSIS — Z79899 Other long term (current) drug therapy: Secondary | ICD-10-CM | POA: Insufficient documentation

## 2018-05-10 NOTE — ED Provider Notes (Signed)
[facility] Emergency Department Provider Note  ____________________________________________  Time seen: Approximately 3:25 PM  I have reviewed the triage vital signs and the nursing notes.   HISTORY  Chief Complaint Elbow Pain    HPI Allison Hobbs is a 52 y.o. female presents to the emergency department with lateral elbow pain after patient reports that she fell 2 to 3 weeks ago.  Patient reports that she fell on an outstretched hand and thinks that something collided against the lateral aspect of her forearm.  Patient has reproducible pain when she tries to extend the wrist or make a fist.  Pain is improved with a resting position.  No numbness or tingling in the right upper extremity.  No subjective weakness.  No neck pain.  No alleviating measures of been attempted.   Past Medical History:  Diagnosis Date  . Hypertension     There are no active problems to display for this patient.   Past Surgical History:  Procedure Laterality Date  . ABDOMINAL HYSTERECTOMY    . APPENDECTOMY    . CHOLECYSTECTOMY    . TUBAL LIGATION      Prior to Admission medications   Medication Sig Start Date End Date Taking? Authorizing Provider  cloNIDine (CATAPRES) 0.1 MG tablet Take 0.1 mg by mouth 2 (two) times daily. 09/13/16 09/13/17  [provider]  cyclobenzaprine (FLEXERIL) 10 MG tablet Take 1 tablet (10 mg total) by mouth 3 (three) times daily as needed. 02/23/18   Joni Reining, PA-C  Diclofenac Sodium CR 100 MG 24 hr tablet Take 100 mg by mouth daily. 12/24/16   [provider]  hydrochlorothiazide (HYDRODIURIL) 25 MG tablet Take 25 mg by mouth daily.    [provider]  meclizine (ANTIVERT) 25 MG tablet Take 1 tablet (25 mg total) by mouth 3 (three) times daily as needed. 12/31/16   Jeanmarie Plant, MD  methylPREDNISolone (MEDROL DOSEPAK) 4 MG TBPK tablet Take Tapered dose as directed 02/23/18   Joni Reining, PA-C  ondansetron  (ZOFRAN) 4 MG tablet Take 1 tablet (4 mg total) by mouth every 8 (eight) hours as needed for nausea or vomiting. 12/31/16   Jeanmarie Plant, MD  tiZANidine (ZANAFLEX) 4 MG tablet Take 4 mg by mouth at bedtime as needed. 12/10/16   [provider]    Allergies Penicillins  Family History  Problem Relation Age of Onset  . Breast cancer Neg Hx     Social History Social History   Tobacco Use  . Smoking status: Never Smoker  . Smokeless tobacco: Never Used  Substance Use Topics  . Alcohol use: Yes  . Drug use: Not on file     Review of Systems  Constitutional: No fever/chills Eyes: No visual changes. No discharge ENT: No upper respiratory complaints. Cardiovascular: no chest pain. Respiratory: no cough. No SOB. Gastrointestinal: No abdominal pain.  No nausea, no vomiting.  No diarrhea.  No constipation. Genitourinary: Negative for dysuria. No hematuria Musculoskeletal: Patient has right elbow pain.  Skin: Negative for rash, abrasions, lacerations, ecchymosis. Neurological: Negative for headaches, focal weakness or numbness.  ____________________________________________   PHYSICAL EXAM:  VITAL SIGNS: ED Triage Vitals [05/10/18 1411]  Enc Vitals Group     BP 118/71     Pulse Rate 87     Resp 17     Temp 98.9 F (37.2 C)     Temp Source Oral     SpO2 100 %     Weight 187  lb (84.8 kg)     Height 5\' 5"  (1.651 m)     Head Circumference      Peak Flow      Pain Score 8     Pain Loc      Pain Edu?      Excl. in GC?      Constitutional: Alert and oriented. Well appearing and in no acute distress. Eyes: Conjunctivae are normal. PERRL. EOMI. Head: Atraumatic. Cardiovascular: Normal rate, regular rhythm. Normal S1 and S2.  Good peripheral circulation. Respiratory: Normal respiratory effort without tachypnea or retractions. Lungs CTAB. Good air entry to the bases with no decreased or absent breath sounds. Musculoskeletal: Patient has 5 out of 5 strength in the  upper extremities bilaterally.  Patient has reproducible pain with resisted flexion at the right wrist.  Pain is reproduced over the lateral epicondyle.  Negative Tinel and Phalen's.  Negative Finkelstein test.  Palpable radial pulse bilaterally and symmetrically. Neurologic:  Normal speech and language. No gross focal neurologic deficits are appreciated.  Skin:  Skin is warm, dry and intact. No rash noted. Psychiatric: Mood and affect are normal. Speech and behavior are normal. Patient exhibits appropriate insight and judgement.   ____________________________________________   LABS (all labs ordered are listed, but only abnormal results are displayed)  Labs Reviewed - No data to display ____________________________________________  EKG   ____________________________________________  RADIOLOGY    No results found.  ____________________________________________    PROCEDURES  Procedure(s) performed:    Procedures    Medications - No data to display   ____________________________________________   INITIAL IMPRESSION / ASSESSMENT AND PLAN / ED COURSE  Pertinent labs & imaging results that were available during my care of the patient were reviewed by me and considered in my medical decision making (see chart for details).  Review of the [place] CSRS was performed in accordance of the [facility] prior to dispensing any controlled drugs.      Assessment and plan:  Right elbow pain:  Patient left emergency department after being seen and evaluated by me but before x-rays were obtained.  Nursing notified me that patient left emergency department.   ____________________________________________  FINAL CLINICAL IMPRESSION(S) / ED DIAGNOSES  Final diagnoses:  Right elbow pain      NEW MEDICATIONS STARTED DURING THIS VISIT:  ED Discharge Orders    None          This chart was dictated using voice recognition software/Dragon. Despite best efforts to proofread,  errors can occur which can change the meaning. Any change was purely unintentional.    Orvil Feil, PA-C 05/10/18 1903    Jeanmarie Plant, MD 05/10/18 2113

## 2018-05-10 NOTE — ED Notes (Signed)
Pt presents to ED with c/c of R elbow pain. Pt states that she slipped at the bottom of her stairs and her elbow impacted an interior doorframe as she reached out to catch herself. States that there was no pain initially but it has increased gradually over the last two weeks. Pt has taken OTC ibuprofen and applied a brace but reports no relief. Last dose of ibuprofen was 2 days ago. Pt indicates pain localized to lateral aspect of elbow, non-radiating, demonstrated active RoM to approx 90 degrees, limited by pain. Distal pulses intact, cap refill <3 sec.

## 2018-05-10 NOTE — ED Triage Notes (Signed)
Pt states she slipped and fell two weeks ago and has been having right elbow pain since.Marland Kitchen

## 2018-05-10 NOTE — ED Notes (Signed)
Radiology tech stated that this patient declined to go to radiology and left without being seen. Pts room was empty when this nurse went to check.

## 2018-05-27 ENCOUNTER — Ambulatory Visit
Admission: RE | Admit: 2018-05-27 | Discharge: 2018-05-27 | Disposition: A | Discharge status: Home / Self Care | Payer: BLUE CROSS/BLUE SHIELD | Source: Ambulatory Visit | Attending: Pediatrics | Admitting: Pediatrics

## 2018-05-27 DIAGNOSIS — Z1231 Encounter for screening mammogram for malignant neoplasm of breast: Secondary | ICD-10-CM | POA: Diagnosis present

## 2018-11-01 IMAGING — MR MR KNEE*R* W/O CM
5 series · 40 of 40 positions shown · non-contrast
Comparison: Plain films right knee 11/01/2014.

CLINICAL DATA: Chronic right knee pain and swelling. No known
injury.

EXAM:
MRI OF THE RIGHT KNEE WITHOUT CONTRAST
TECHNIQUE: Multiplanar, multisequence MR imaging of the knee was performed. No
intravenous contrast was administered.

[Series 5: PD fat-sat · axial · 3.0mm · 0.33mm/px · z∈[-55,+57]mm · 8 of 35 slices shown (1 of 3)]
[im 1/35]
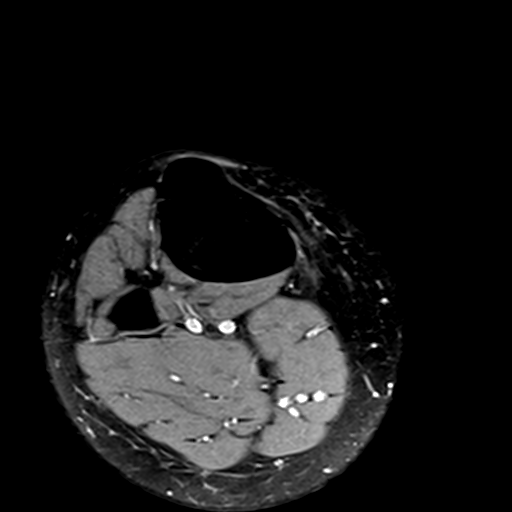
[im 5/35]
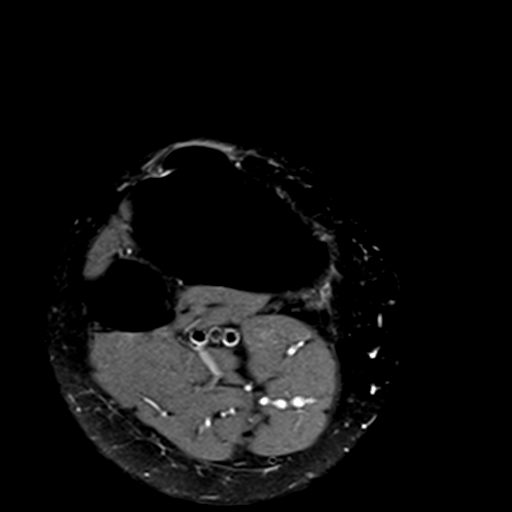
[im 10/35]
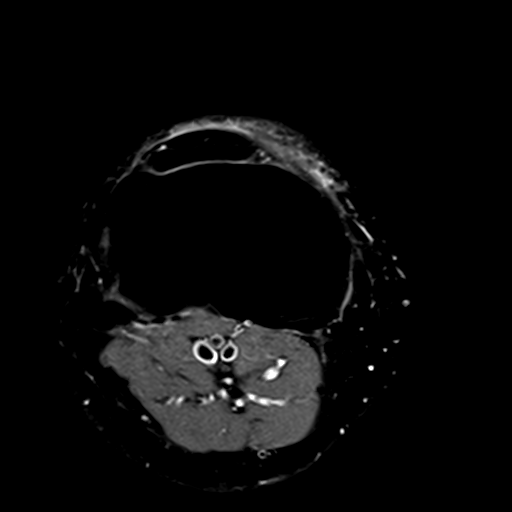
[im 15/35]
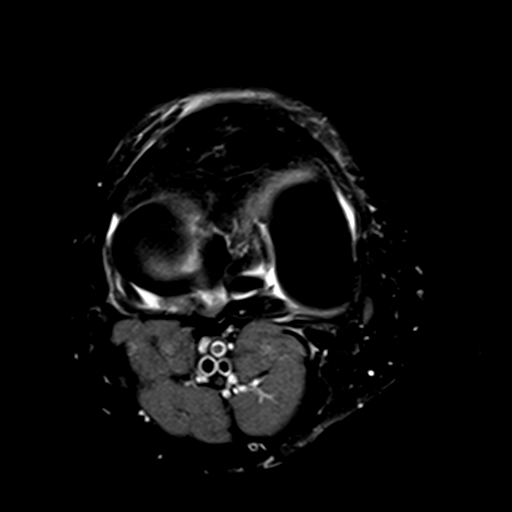
[im 20/35]
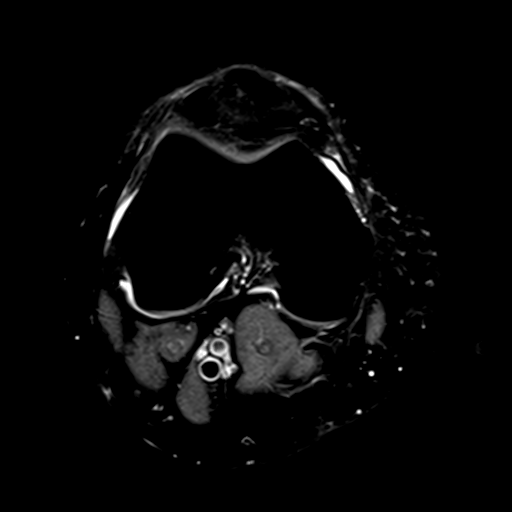
[im 25/35]
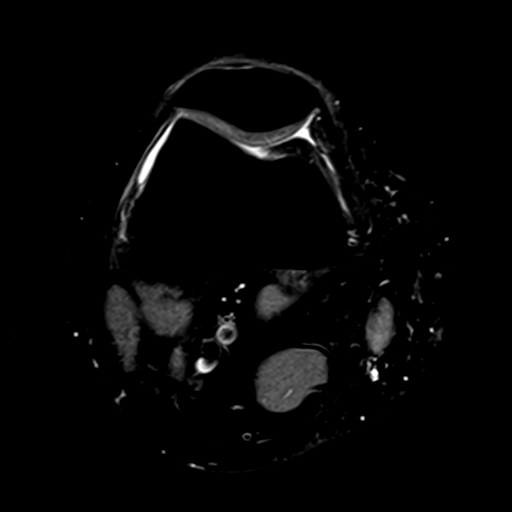
[im 30/35]
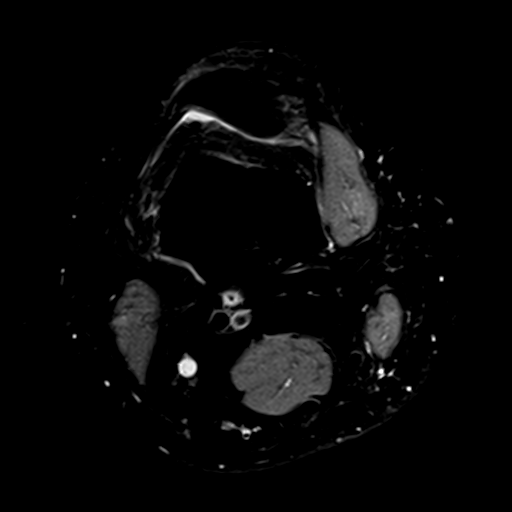
[im 35/35]
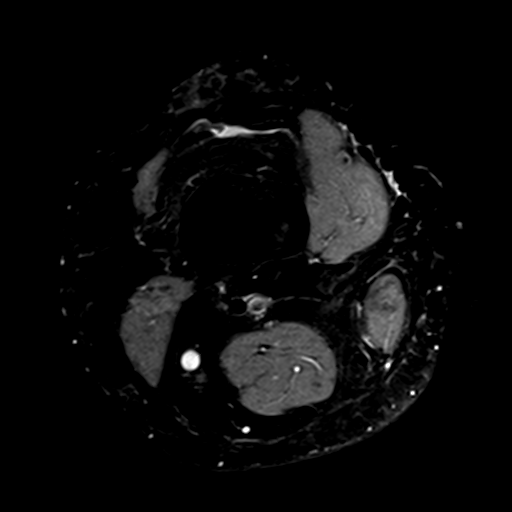

[Series 6: T1 · coronal · 3.0mm · 0.50mm/px · 8 of 31 slices shown]
[im 1/31]
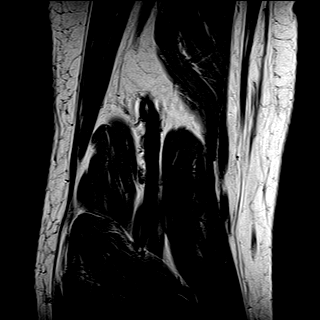
[im 5/31]
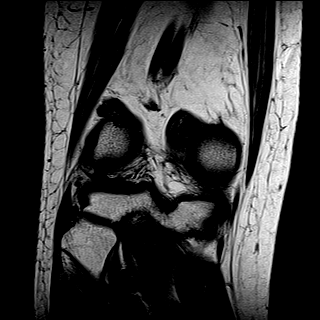
[im 9/31]
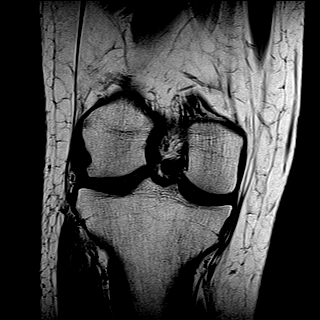
[im 13/31]
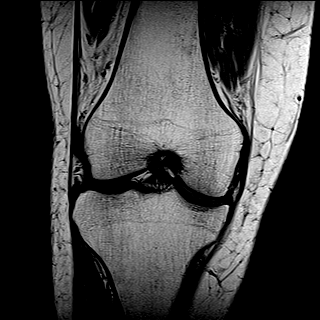
[im 18/31]
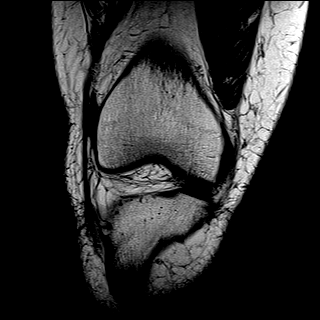
[im 22/31]
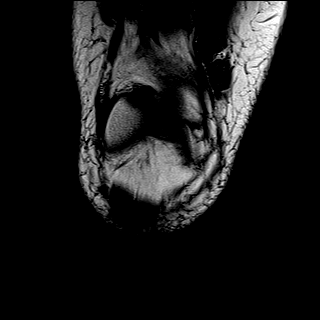
[im 26/31]
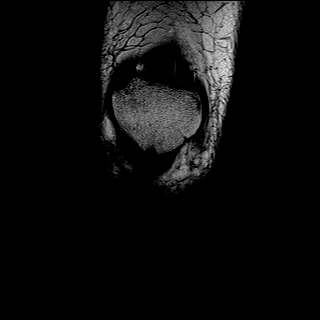
[im 31/31]
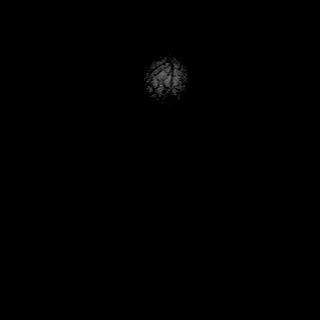

[Series 7: T2 fat-sat · coronal · 3.0mm · 0.50mm/px · 8 of 31 slices shown]
[im 1/31]
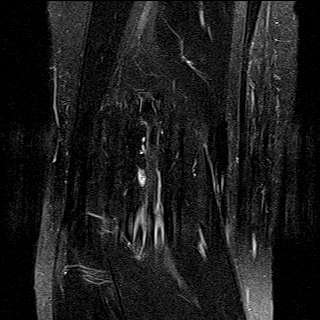
[im 5/31]
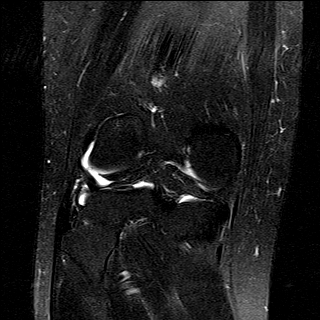
[im 9/31]
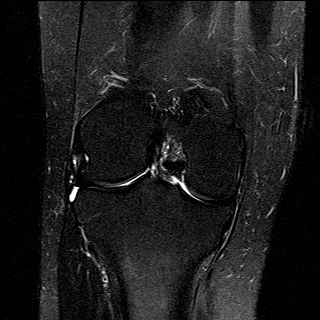
[im 13/31]
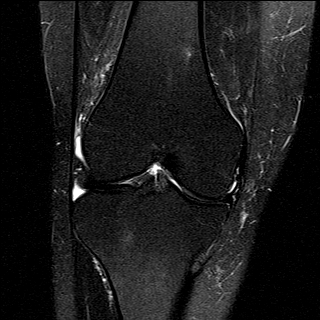
[im 18/31]
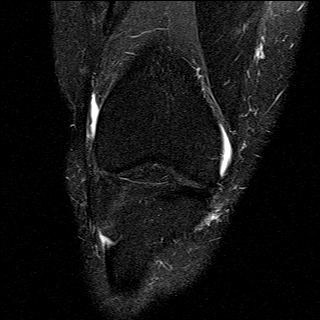
[im 22/31]
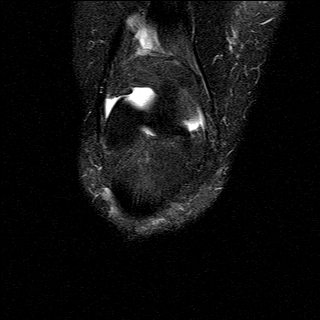
[im 26/31]
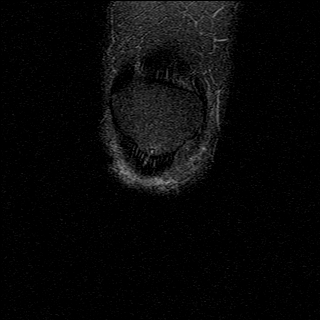
[im 31/31]
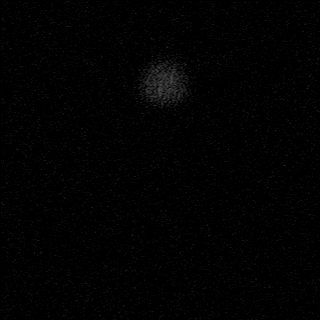

[Series 8: PD fat-sat · coronal · 3.0mm · 0.62mm/px · 8 of 31 slices shown (2 of 3)]
[im 1/31]
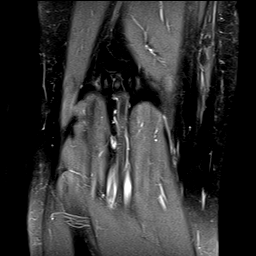
[im 5/31]
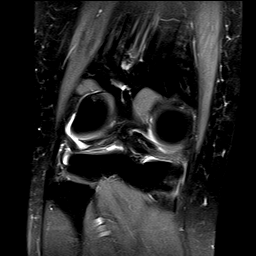
[im 9/31]
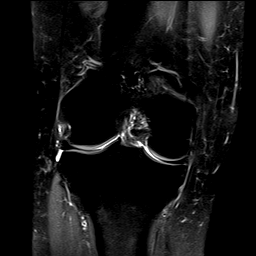
[im 13/31]
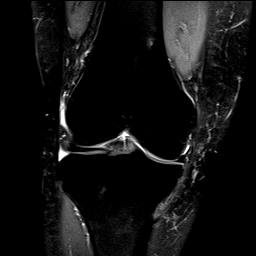
[im 18/31]
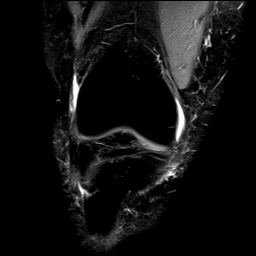
[im 22/31]
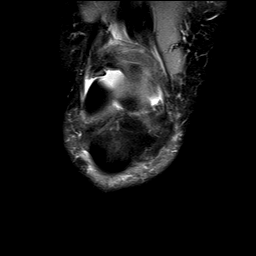
[im 26/31]
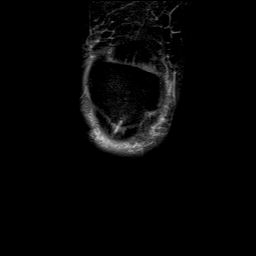
[im 31/31]
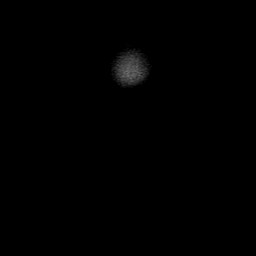

[Series 9: PD fat-sat · sagittal · 3.0mm · 0.62mm/px · 8 of 33 slices shown (3 of 3)]
[im 1/33]
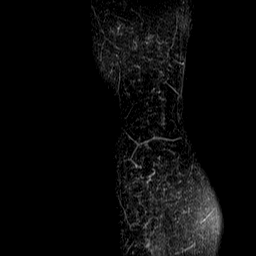
[im 5/33]
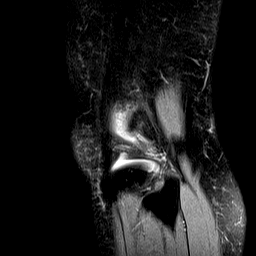
[im 10/33]
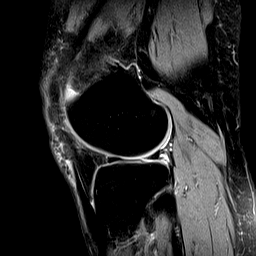
[im 14/33]
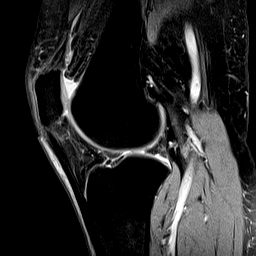
[im 19/33]
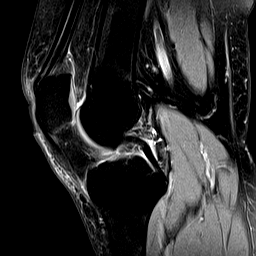
[im 23/33]
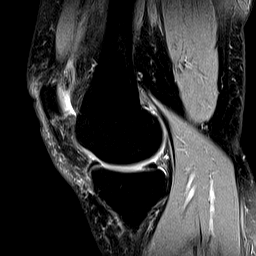
[im 28/33]
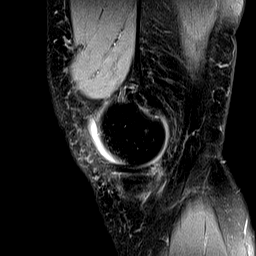
[im 33/33]
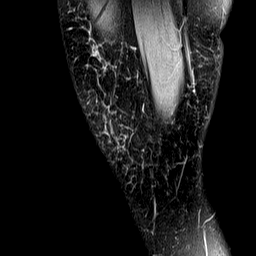

[40 of 40 positions shown; findings below may reference images not displayed]

FINDINGS: MENISCI

Medial meniscus:  Intact.

Lateral meniscus:  Intact.

LIGAMENTS

Cruciates:  Intact.

Collaterals:  Intact.

CARTILAGE

Patellofemoral:  Appears normal.

Medial:  Appears normal.

Lateral:  Appears normal.

Joint:  Trace amount of joint fluid.

Popliteal Fossa:  No Baker's cyst.

Extensor Mechanism: Intrasubstance increased T2 signal is seen in
the superior fibers of the central patellar tendon consistent with
tendinosis. No tear.

Bones:  No fracture or worrisome lesion.

Other: None.
IMPRESSION: Tendinosis of the superior fibers of the patellar tendon without
tear. The study is otherwise negative.

## 2018-11-30 ENCOUNTER — Ambulatory Visit (INDEPENDENT_AMBULATORY_CARE_PROVIDER_SITE_OTHER): Payer: Self-pay

## 2018-11-30 ENCOUNTER — Other Ambulatory Visit: Payer: Self-pay

## 2018-11-30 ENCOUNTER — Ambulatory Visit
Admission: EM | Admit: 2018-11-30 | Discharge: 2018-11-30 | Disposition: A | Discharge status: Home / Self Care | Payer: Self-pay | Attending: Family Medicine | Admitting: Family Medicine

## 2018-11-30 DIAGNOSIS — M542 Cervicalgia: Secondary | ICD-10-CM

## 2018-11-30 DIAGNOSIS — S161XXA Strain of muscle, fascia and tendon at neck level, initial encounter: Secondary | ICD-10-CM

## 2018-11-30 DIAGNOSIS — M62838 Other muscle spasm: Secondary | ICD-10-CM

## 2018-11-30 MED ORDER — TIZANIDINE HCL 4 MG PO TABS: 4.0000 mg | ORAL_TABLET | Freq: Three times a day (TID) | ORAL | 0 refills | Status: DC | PRN

## 2018-11-30 NOTE — Discharge Instructions (Signed)
Xray negative for fracture.  Heat.  Muscle relaxant as prescribed.  Take care  Dr. Adriana Simas
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
## 2019-03-31 ENCOUNTER — Emergency Department
Admission: EM | Admit: 2019-03-31 | Discharge: 2019-03-31 | Disposition: A | Discharge status: Home / Self Care | Payer: BLUE CROSS/BLUE SHIELD | Attending: Emergency Medicine | Admitting: Emergency Medicine

## 2019-03-31 ENCOUNTER — Emergency Department: Payer: BLUE CROSS/BLUE SHIELD

## 2019-03-31 ENCOUNTER — Other Ambulatory Visit: Payer: Self-pay

## 2019-03-31 DIAGNOSIS — B029 Zoster without complications: Secondary | ICD-10-CM | POA: Insufficient documentation

## 2019-03-31 DIAGNOSIS — I10 Essential (primary) hypertension: Secondary | ICD-10-CM | POA: Diagnosis not present

## 2019-03-31 DIAGNOSIS — R51 Headache: Secondary | ICD-10-CM | POA: Diagnosis present

## 2019-03-31 DIAGNOSIS — G44209 Tension-type headache, unspecified, not intractable: Secondary | ICD-10-CM | POA: Diagnosis not present

## 2019-03-31 DIAGNOSIS — G44201 Tension-type headache, unspecified, intractable: Secondary | ICD-10-CM

## 2019-03-31 LAB — BASIC METABOLIC PANEL
Anion gap: 11 (ref 5–15)
BUN: 10 mg/dL (ref 6–20)
CO2: 28 mmol/L (ref 22–32)
Calcium: 9.5 mg/dL (ref 8.9–10.3)
Chloride: 101 mmol/L (ref 98–111)
Creatinine, Ser: 0.89 mg/dL (ref 0.44–1.00)
GFR calc Af Amer: >60 mL/min (ref >60)
GFR calc non Af Amer: >60 mL/min (ref >60)
Glucose, Bld: 133 mg/dL — ABNORMAL HIGH (ref 70–99)
Potassium: 3.5 mmol/L (ref 3.5–5.1)
Sodium: 140 mmol/L (ref 135–145)

## 2019-03-31 LAB — CBC
HCT: 40.6 % (ref 36.0–46.0)
Hemoglobin: 13.3 g/dL (ref 12.0–15.0)
MCH: 27.7 pg (ref 26.0–34.0)
MCHC: 32.8 g/dL (ref 30.0–36.0)
MCV: 84.6 fL (ref 80.0–100.0)
Platelets: 263 10*3/uL (ref 150–400)
RBC: 4.8 MIL/uL (ref 3.87–5.11)
RDW: 13.1 % (ref 11.5–15.5)
WBC: 5.2 10*3/uL (ref 4.0–10.5)
nRBC: 0 % (ref 0.0–0.2)

## 2019-03-31 MED ORDER — DIPHENHYDRAMINE HCL 50 MG/ML IJ SOLN
25.0000 mg | Freq: Once | INTRAMUSCULAR | Status: AC
  Administered 2019-03-31: 25 mg via INTRAVENOUS
  Filled 2019-03-31: qty 1

## 2019-03-31 MED ORDER — VALACYCLOVIR HCL 500 MG PO TABS
1000.0000 mg | ORAL_TABLET | Freq: Once | ORAL | Status: AC
  Administered 2019-03-31: 1000 mg via ORAL
  Filled 2019-03-31: qty 2

## 2019-03-31 MED ORDER — METOCLOPRAMIDE HCL 5 MG/ML IJ SOLN
10.0000 mg | Freq: Once | INTRAMUSCULAR | Status: AC
  Administered 2019-03-31: 10 mg via INTRAVENOUS
  Filled 2019-03-31: qty 2

## 2019-03-31 MED ORDER — PREDNISONE 10 MG PO TABS: ORAL_TABLET | ORAL | 0 refills | Status: DC

## 2019-03-31 MED ORDER — KETOROLAC TROMETHAMINE 30 MG/ML IJ SOLN
30.0000 mg | Freq: Once | INTRAMUSCULAR | Status: AC
  Administered 2019-03-31: 30 mg via INTRAVENOUS
  Filled 2019-03-31: qty 1

## 2019-03-31 MED ORDER — VALACYCLOVIR HCL 1 G PO TABS: 1000.0000 mg | ORAL_TABLET | Freq: Three times a day (TID) | ORAL | 0 refills | Status: DC

## 2019-03-31 MED ORDER — SODIUM CHLORIDE 0.9 % IV BOLUS
1000.0000 mL | Freq: Once | INTRAVENOUS | Status: AC
  Administered 2019-03-31: 1000 mL via INTRAVENOUS

## 2019-03-31 MED ORDER — DEXAMETHASONE SODIUM PHOSPHATE 10 MG/ML IJ SOLN
10.0000 mg | Freq: Once | INTRAMUSCULAR | Status: AC
  Administered 2019-03-31: 10 mg via INTRAVENOUS
  Filled 2019-03-31: qty 1

## 2019-03-31 NOTE — ED Notes (Signed)
Patient transported to CT 

## 2019-03-31 NOTE — Discharge Instructions (Signed)
Your rash is consistent with shingles.  Please continue Valtrex and prednisone for rash.  You can continue taking ibuprofen today for headache.

## 2019-03-31 NOTE — ED Provider Notes (Signed)
[facility] Emergency Department Provider Note  ____________________________________________  Time seen: Approximately 11:16 AM  I have reviewed the triage vital signs and the nursing notes.   HISTORY  Chief Complaint Headache and Rash    HPI Allison Hobbs is a 53 y.o. female presents emergency department for evaluation of rash to left chest/breast for 3 days and headache for 1 day.  Rash is painful.  It does not itch.  Patient denies any history of headaches.  Patient states that her headaches usually resolve with BC powders.  This headache has lasted longer than her normal headaches.  Patient does not recall if she had chickenpox as a child.  She took a BC powder this morning.  Patient had chills yesterday but did not take her temperature.  No dizziness, neck pain, visual changes, nausea, vomiting.   Past Medical History:  Diagnosis Date  . Hypertension     There are no active problems to display for this patient.   Past Surgical History:  Procedure Laterality Date  . ABDOMINAL HYSTERECTOMY    . APPENDECTOMY    . CHOLECYSTECTOMY    . TUBAL LIGATION      Prior to Admission medications   Medication Sig Start Date End Date Taking? Authorizing Provider  cetirizine (ZYRTEC) 10 MG tablet Take 10 mg by mouth daily.    [provider]  hydrochlorothiazide (HYDRODIURIL) 25 MG tablet Take 25 mg by mouth daily.    [provider]  Multiple Vitamins-Minerals (MULTIVITAMIN WITH MINERALS) tablet Take 1 tablet by mouth daily.    [provider]  predniSONE (DELTASONE) 10 MG tablet Take 6 tablets on day 1, take 5 tablets on day 2, take 4 tablets on day 3, take 3 tablets on day 4, take 2 tablets on day 5, take 1 tablet on day 6 03/31/19   Enid Derry, PA-C  tiZANidine (ZANAFLEX) 4 MG tablet Take 1 tablet (4 mg total) by mouth every 8 (eight) hours as needed for muscle spasms. 11/30/18   Tommie Sams, DO  valACYclovir (VALTREX) 1000 MG  tablet Take 1 tablet (1,000 mg total) by mouth 3 (three) times daily. 03/31/19   Enid Derry, PA-C    Allergies Penicillins  Family History  Problem Relation Age of Onset  . Lung cancer Mother   . Breast cancer Neg Hx     Social History Social History   Tobacco Use  . Smoking status: Never Smoker  . Smokeless tobacco: Never Used  Substance Use Topics  . Alcohol use: Yes  . Drug use: Never     Review of Systems  Cardiovascular: No chest pain. Respiratory: No SOB. Musculoskeletal: Negative for musculoskeletal pain. Skin: Negative for abrasions, lacerations, ecchymosis.  Positive for rash. Neurological: Positive for headache.   ____________________________________________   PHYSICAL EXAM:  VITAL SIGNS: ED Triage Vitals  Enc Vitals Group     BP 03/31/19 0901 103/77     Pulse Rate 03/31/19 0901 90     Resp 03/31/19 0901 18     Temp 03/31/19 0901 99.5 F (37.5 C)     Temp Source 03/31/19 0901 Oral     SpO2 03/31/19 0901 100 %     Weight 03/31/19 0902 188 lb (85.3 kg)     Height 03/31/19 0902 5\' 5"  (1.651 m)     Head Circumference --      Peak Flow --      Pain Score 03/31/19 0902 8     Pain Loc --  Pain Edu? --      Excl. in GC? --      Constitutional: Alert and oriented. Well appearing and in no acute distress. Eyes: Conjunctivae are normal. PERRL. EOMI. Head: Atraumatic. ENT:      Ears:      Nose: No congestion/rhinnorhea.      Mouth/Throat: Mucous membranes are moist.  Neck: No stridor.  No neck pain.  Full range of motion of neck. Cardiovascular: Normal rate, regular rhythm.  Good peripheral circulation. Respiratory: Normal respiratory effort without tachypnea or retractions. Lungs CTAB. Good air entry to the bases with no decreased or absent breath sounds. Gastrointestinal: Bowel sounds 4 quadrants. Soft and nontender to palpation. No guarding or rigidity. No palpable masses. No distention.  Musculoskeletal: Full range of motion to all  extremities. No gross deformities appreciated. Neurologic:  Normal speech and language. No gross focal neurologic deficits are appreciated.  Skin:  Skin is warm, dry and intact. Vesicles to T5/T6 dermatome. Psychiatric: Mood and affect are normal. Speech and behavior are normal. Patient exhibits appropriate insight and judgement.   ____________________________________________   LABS (all labs ordered are listed, but only abnormal results are displayed)  Labs Reviewed  BASIC METABOLIC PANEL - Abnormal; Notable for the following components:      Result Value   Glucose, Bld 133 (*)    All other components within normal limits  CBC   ____________________________________________  EKG   ____________________________________________  RADIOLOGY Lexine BatonI, Erez Mccallum, personally viewed and evaluated these images (plain radiographs) as part of my medical decision making, as well as reviewing the written report by the radiologist.  Ct Head Wo Contrast  Result Date: 03/31/2019 CLINICAL DATA:  Severe headache for 24 hours EXAM: CT HEAD WITHOUT CONTRAST TECHNIQUE: Contiguous axial images were obtained from the base of the skull through the vertex without intravenous contrast. COMPARISON:  MR brain, 12/31/2016 FINDINGS: Brain: No evidence of acute infarction, hemorrhage, hydrocephalus, extra-axial collection or mass lesion/mass effect. Vascular: No hyperdense vessel or unexpected calcification. Skull: Normal. Negative for fracture or focal lesion. Sinuses/Orbits: No acute finding. Other: None. IMPRESSION: No acute intracranial pathology. No non-contrast CT findings to explain headache. Electronically Signed   By: Lauralyn PrimesAlex  Bibbey M.D.   On: 03/31/2019 10:05    ____________________________________________    PROCEDURES  Procedure(s) performed:    Procedures    Medications  sodium chloride 0.9 % bolus 1,000 mL (0 mLs Intravenous Stopped 03/31/19 1159)  dexamethasone (DECADRON) injection 10 mg (10  mg Intravenous Given 03/31/19 1046)  metoCLOPramide (REGLAN) injection 10 mg (10 mg Intravenous Given 03/31/19 1044)  diphenhydrAMINE (BENADRYL) injection 25 mg (25 mg Intravenous Given 03/31/19 1045)  ketorolac (TORADOL) 30 MG/ML injection 30 mg (30 mg Intravenous Given 03/31/19 1154)  valACYclovir (VALTREX) tablet 1,000 mg (1,000 mg Oral Given 03/31/19 1152)     ____________________________________________   INITIAL IMPRESSION / ASSESSMENT AND PLAN / ED COURSE  Pertinent labs & imaging results that were available during my care of the patient were reviewed by me and considered in my medical decision making (see chart for details).  Review of the [place] CSRS was performed in accordance of the [facility] prior to dispensing any controlled drugs.   Patient's diagnosis is consistent with shingles and headache.  Vital signs and exam are reassuring.  Exam is consistent with shingles.  Headache resolved with Decadron, Benadryl, Reglan, Toradol.  Dr. Mayford KnifeWilliams came to also evaluate patient and is agreeable with plan of discharge home with prednisone and Valtrex.  Patient will be  discharged home with prescriptions for Valtrex and prednisone. Patient is to follow up with primary care as directed. Patient is given ED precautions to return to the ED for any worsening or new symptoms.   SHILYNN HOCH was evaluated in Emergency Department on 03/31/2019 for the symptoms described in the history of present illness. She was evaluated in the context of the global COVID-19 pandemic, which necessitated consideration that the patient might be at risk for infection with the SARS-CoV-2 virus that causes COVID-19. Institutional protocols and algorithms that pertain to the evaluation of patients at risk for COVID-19 are in a state of rapid change based on information released by regulatory bodies including the CDC and federal and state organizations. These policies and algorithms were followed during the patient's care in the  ED.  ____________________________________________  FINAL CLINICAL IMPRESSION(S) / ED DIAGNOSES  Final diagnoses:  Herpes zoster without complication  Acute intractable tension-type headache      NEW MEDICATIONS STARTED DURING THIS VISIT:  ED Discharge Orders         Ordered    valACYclovir (VALTREX) 1000 MG tablet  3 times daily     03/31/19 1258    predniSONE (DELTASONE) 10 MG tablet     03/31/19 1258              This chart was dictated using voice recognition software/Dragon. Despite best efforts to proofread, errors can occur which can change the meaning. Any change was purely unintentional.    Laban Emperor, PA-C 03/31/19 1513    Earleen Newport, MD 04/01/19 [phone]

## 2019-03-31 NOTE — ED Triage Notes (Signed)
Headache X 2 days, rash under left breast since Sunday. Pt alert and oriented X4, cooperative, RR even and unlabored, color WNL. Pt in NAD. Denies urinary sx.

## 2019-03-31 NOTE — ED Notes (Signed)
Pt has raised red rash patch between breasts and below the rt breast pt has raised rash spots red in color. Pt stated rash has been present for 4 days and treated at home with hydrocortisone.

## 2019-03-31 NOTE — ED Provider Notes (Signed)
Patient was seen and examined by me, labs and CT imaging was unremarkable.  She does have shingles over and around her left breast which will be treated.  No clear etiology for headache which appears to be resolving.  She is cleared for outpatient follow-up.   Earleen Newport, MD 03/31/19 [phone]

## 2019-05-03 ENCOUNTER — Other Ambulatory Visit: Payer: Self-pay | Admitting: Pediatrics

## 2019-05-03 DIAGNOSIS — Z1231 Encounter for screening mammogram for malignant neoplasm of breast: Secondary | ICD-10-CM

## 2019-11-08 ENCOUNTER — Ambulatory Visit
Admission: RE | Admit: 2019-11-08 | Discharge: 2019-11-08 | Disposition: A | Discharge status: Home / Self Care | Payer: 59 | Source: Ambulatory Visit | Attending: Pediatrics | Admitting: Pediatrics

## 2019-11-08 DIAGNOSIS — Z1231 Encounter for screening mammogram for malignant neoplasm of breast: Secondary | ICD-10-CM

## 2020-05-16 ENCOUNTER — Other Ambulatory Visit: Payer: Self-pay

## 2020-05-16 ENCOUNTER — Ambulatory Visit
Admission: EM | Admit: 2020-05-16 | Discharge: 2020-05-16 | Disposition: A | Discharge status: Home / Self Care | Payer: 59 | Attending: Emergency Medicine | Admitting: Emergency Medicine

## 2020-05-16 DIAGNOSIS — M791 Myalgia, unspecified site: Secondary | ICD-10-CM | POA: Diagnosis not present

## 2020-05-16 DIAGNOSIS — R062 Wheezing: Secondary | ICD-10-CM | POA: Insufficient documentation

## 2020-05-16 DIAGNOSIS — J069 Acute upper respiratory infection, unspecified: Secondary | ICD-10-CM | POA: Insufficient documentation

## 2020-05-16 DIAGNOSIS — R059 Cough, unspecified: Secondary | ICD-10-CM | POA: Insufficient documentation

## 2020-05-16 DIAGNOSIS — Z79899 Other long term (current) drug therapy: Secondary | ICD-10-CM | POA: Insufficient documentation

## 2020-05-16 DIAGNOSIS — R110 Nausea: Secondary | ICD-10-CM | POA: Diagnosis not present

## 2020-05-16 DIAGNOSIS — R0981 Nasal congestion: Secondary | ICD-10-CM | POA: Insufficient documentation

## 2020-05-16 DIAGNOSIS — I10 Essential (primary) hypertension: Secondary | ICD-10-CM | POA: Insufficient documentation

## 2020-05-16 DIAGNOSIS — M2550 Pain in unspecified joint: Secondary | ICD-10-CM | POA: Insufficient documentation

## 2020-05-16 DIAGNOSIS — Z20822 Contact with and (suspected) exposure to covid-19: Secondary | ICD-10-CM | POA: Insufficient documentation

## 2020-05-16 DIAGNOSIS — J029 Acute pharyngitis, unspecified: Secondary | ICD-10-CM | POA: Insufficient documentation

## 2020-05-16 LAB — SARS CORONAVIRUS 2 (TAT 6-24 HRS): SARS Coronavirus 2: NEGATIVE

## 2020-05-16 MED ORDER — PROMETHAZINE-DM 6.25-15 MG/5ML PO SYRP: 5.0000 mL | ORAL_SOLUTION | Freq: Four times a day (QID) | ORAL | 0 refills | Status: DC | PRN

## 2020-05-16 MED ORDER — BENZONATATE 100 MG PO CAPS: 200.0000 mg | ORAL_CAPSULE | Freq: Three times a day (TID) | ORAL | 0 refills | Status: DC

## 2020-05-16 NOTE — ED Triage Notes (Signed)
Patient states that she has been coughing and having nasal congestion x 6 days. States that she has been having chills. Patient states that she was around her son and he got sick after being around her and he is positive for covid.

## 2020-05-16 NOTE — ED Provider Notes (Signed)
[facility]    CSN: [id number] Arrival date & time: 05/16/20  0845      History   Chief Complaint Chief Complaint  Patient presents with   Cough    HPI Allison Hobbs is a 54 y.o. female.   87-year-old female presents for evaluation of cough, nasal congestion, chills, sore throat, body aches x6 days.  She is also had some associated diminished taste sense, decreased appetite, nausea, and wheezing.  Patient is unsure if she is ever had a fever because she has not checked her temp.  She denies vomiting or diarrhea, ear pain, runny nose, or shortness of breath.  Patient is fully vaccinated with the Pfizer series but has not received her booster.  She has been using Alka-Seltzer cold and Delsym at home without any relief of her cough.  Patient reports that her cough is mostly dry and intermittently productive for clear sputum.     Past Medical History:  Diagnosis Date   Hypertension     There are no problems to display for this patient.   Past Surgical History:  Procedure Laterality Date   ABDOMINAL HYSTERECTOMY     APPENDECTOMY     CHOLECYSTECTOMY     TUBAL LIGATION      OB History   No obstetric history on file.      Home Medications    Prior to Admission medications   Medication Sig Start Date End Date Taking? Authorizing Provider  cetirizine (ZYRTEC) 10 MG tablet Take 10 mg by mouth daily.   Yes [provider]  hydrochlorothiazide (HYDRODIURIL) 25 MG tablet Take 25 mg by mouth daily.   Yes [provider]  Multiple Vitamins-Minerals (MULTIVITAMIN WITH MINERALS) tablet Take 1 tablet by mouth daily.   Yes [provider]  benzonatate (TESSALON) 100 MG capsule Take 2 capsules (200 mg total) by mouth every 8 (eight) hours. 05/16/20   Becky Augusta, NP  promethazine-dextromethorphan (PROMETHAZINE-DM) 6.25-15 MG/5ML syrup Take 5 mLs by mouth 4 (four) times daily as needed. 05/16/20   Becky Augusta, NP    Family  History Family History  Problem Relation Age of Onset   Lung cancer Mother    Breast cancer Neg Hx     Social History Social History   Tobacco Use   Smoking status: Never Smoker   Smokeless tobacco: Never Used  Building services engineer Use: Never used  Substance Use Topics   Alcohol use: Yes   Drug use: Never     Allergies   Penicillins   Review of Systems Review of Systems  Constitutional: Positive for chills and diaphoresis. Negative for activity change and fever.  HENT: Positive for congestion and sore throat. Negative for ear pain, hearing loss and rhinorrhea.   Respiratory: Positive for cough and wheezing.        Patient reports that she heard herself wheeze last night.  Cardiovascular: Negative for chest pain.  Gastrointestinal: Positive for nausea. Negative for abdominal pain, diarrhea and vomiting.  Genitourinary: Negative for dysuria and frequency.  Musculoskeletal: Positive for arthralgias and myalgias.  Skin: Negative for rash.  Neurological: Negative for syncope and headaches.  Hematological: Negative.   Psychiatric/Behavioral: Negative.      Physical Exam Triage Vital Signs ED Triage Vitals  Enc Vitals Group     BP 05/16/20 0913 118/86     Pulse Rate 05/16/20 0913 73     Resp 05/16/20 0913 18     Temp 05/16/20 0913 98.5 F (36.9 C)  Temp Source 05/16/20 0913 Oral     SpO2 05/16/20 0913 99 %     Weight 05/16/20 0911 182 lb (82.6 kg)     Height 05/16/20 0911 5\' 5"  (1.651 m)     Head Circumference --      Peak Flow --      Pain Score 05/16/20 0911 9     Pain Loc --      Pain Edu? --      Excl. in GC? --    No data found.  Updated Vital Signs BP 118/86 (BP Location: Left Arm)    Pulse 73    Temp 98.5 F (36.9 C) (Oral)    Resp 18    Ht 5\' 5"  (1.651 m)    Wt 182 lb (82.6 kg)    SpO2 99%    BMI 30.29 kg/m   Visual Acuity Right Eye Distance:   Left Eye Distance:   Bilateral Distance:    Right Eye Near:   Left Eye Near:     Bilateral Near:     Physical Exam Vitals and nursing note reviewed.  Constitutional:      General: She is not in acute distress.    Appearance: Normal appearance. She is not ill-appearing.  HENT:     Head: Normocephalic and atraumatic.     Right Ear: Tympanic membrane, ear canal and external ear normal. There is no impacted cerumen.     Left Ear: Tympanic membrane, ear canal and external ear normal. There is no impacted cerumen.     Nose: Congestion present.     Comments: Nasal mucosa is pink and mildly edematous.  No discharge noted in the naris.    Mouth/Throat:     Mouth: Mucous membranes are moist.     Pharynx: Oropharynx is clear. No posterior oropharyngeal erythema.     Comments: There is some clear postnasal drip in the posterior oropharynx but no associated redness or inflammation of the tissue. Eyes:     General: No scleral icterus.    Extraocular Movements: Extraocular movements intact.     Conjunctiva/sclera: Conjunctivae normal.     Pupils: Pupils are equal, round, and reactive to light.  Cardiovascular:     Rate and Rhythm: Normal rate and regular rhythm.     Pulses: Normal pulses.     Heart sounds: Normal heart sounds. No murmur heard.  No gallop.   Pulmonary:     Effort: Pulmonary effort is normal.     Breath sounds: Normal breath sounds. No wheezing, rhonchi or rales.  Musculoskeletal:        General: No swelling or tenderness.     Cervical back: Normal range of motion and neck supple. No tenderness.  Lymphadenopathy:     Cervical: No cervical adenopathy.  Skin:    General: Skin is warm and dry.     Capillary Refill: Capillary refill takes less than 2 seconds.     Coloration: Skin is not jaundiced.     Findings: No erythema or rash.  Neurological:     General: No focal deficit present.     Mental Status: She is alert and oriented to person, place, and time.  Psychiatric:        Mood and Affect: Mood normal.        Behavior: Behavior normal.         Thought Content: Thought content normal.        Judgment: Judgment normal.      UC Treatments /  Results  Labs (all labs ordered are listed, but only abnormal results are displayed) Labs Reviewed  SARS CORONAVIRUS 2 (TAT 6-24 HRS)    EKG   Radiology No results found.  Procedures Procedures (including critical care time)  Medications Ordered in UC Medications - No data to display  Initial Impression / Assessment and Plan / UC Course  I have reviewed the triage vital signs and the nursing notes.  Pertinent labs & imaging results that were available during my care of the patient were reviewed by me and considered in my medical decision making (see chart for details).   Patient presents for evaluation of cold symptoms suspicious for Covid.  Her son recently tested positive for Covid and he did not become sick until after he had been around the patient.  Patient has a cough, nasal congestion, chills, changes to taste sense.  Patient is nontoxic appearing.  She has had her full Pfizer vaccination series.  Will swab for Covid and DC home with Tessalon Perles and Promethazine DM for cough control.  We will also give precautions.   Final Clinical Impressions(s) / UC Diagnoses   Final diagnoses:  Viral URI with cough     Discharge Instructions     Isolate until the results of your Covid test are back.  If your test is positive you will need to quarantine for 10 days from the start of your symptoms.  After the 10 days you can break quarantine if your symptoms have improved you have not run a fever for 24 hours.  Use the Tessalon Perles every 8 hours as needed for cough.  Take them with a small sip of water.  You may experience some numbness to the base of your tongue or a slight metallic taste in her mouth which is normal.  Use the Promethazine DM at bedtime for cough, congestion and sleep.  If you develop worsening shortness of breath, especially at rest, and cannot catch her  breath or speak a sentence you need to go to the emergency department for evaluation.    ED Prescriptions    Medication Sig Dispense Auth. Provider   benzonatate (TESSALON) 100 MG capsule Take 2 capsules (200 mg total) by mouth every 8 (eight) hours. 21 capsule Becky Augusta, NP   promethazine-dextromethorphan (PROMETHAZINE-DM) 6.25-15 MG/5ML syrup Take 5 mLs by mouth 4 (four) times daily as needed. 118 mL Becky Augusta, NP     I have reviewed the PDMP during this encounter.   Becky Augusta, NP 05/16/20 [phone]

## 2020-05-16 NOTE — Discharge Instructions (Addendum)
Isolate until the results of your Covid test are back.  If your test is positive you will need to quarantine for 10 days from the start of your symptoms.  After the 10 days you can break quarantine if your symptoms have improved you have not run a fever for 24 hours.  Use the Tessalon Perles every 8 hours as needed for cough.  Take them with a small sip of water.  You may experience some numbness to the base of your tongue or a slight metallic taste in her mouth which is normal.  Use the Promethazine DM at bedtime for cough, congestion and sleep.  If you develop worsening shortness of breath, especially at rest, and cannot catch her breath or speak a sentence you need to go to the emergency department for evaluation.

## 2020-10-10 ENCOUNTER — Other Ambulatory Visit: Payer: Self-pay | Admitting: Pediatrics

## 2020-10-10 DIAGNOSIS — Z1231 Encounter for screening mammogram for malignant neoplasm of breast: Secondary | ICD-10-CM

## 2020-11-08 ENCOUNTER — Ambulatory Visit: Payer: 59

## 2020-11-15 ENCOUNTER — Ambulatory Visit
Admission: RE | Admit: 2020-11-15 | Discharge: 2020-11-15 | Disposition: A | Discharge status: Home / Self Care | Payer: 59 | Source: Ambulatory Visit | Attending: Pediatrics | Admitting: Pediatrics

## 2020-11-15 ENCOUNTER — Other Ambulatory Visit: Payer: Self-pay

## 2020-11-15 DIAGNOSIS — Z1231 Encounter for screening mammogram for malignant neoplasm of breast: Secondary | ICD-10-CM | POA: Diagnosis present

## 2021-02-02 ENCOUNTER — Other Ambulatory Visit: Payer: Self-pay

## 2021-02-02 ENCOUNTER — Ambulatory Visit
Admission: EM | Admit: 2021-02-02 | Discharge: 2021-02-02 | Disposition: A | Discharge status: Home / Self Care | Payer: 59 | Attending: Physician Assistant | Admitting: Physician Assistant

## 2021-02-02 ENCOUNTER — Encounter: Payer: Self-pay | Admitting: Emergency Medicine

## 2021-02-02 DIAGNOSIS — R059 Cough, unspecified: Secondary | ICD-10-CM | POA: Diagnosis present

## 2021-02-02 DIAGNOSIS — U071 COVID-19: Secondary | ICD-10-CM | POA: Insufficient documentation

## 2021-02-02 MED ORDER — BENZONATATE 100 MG PO CAPS: 200.0000 mg | ORAL_CAPSULE | Freq: Three times a day (TID) | ORAL | 0 refills | Status: DC | PRN

## 2021-02-02 MED ORDER — PROMETHAZINE-DM 6.25-15 MG/5ML PO SYRP: 5.0000 mL | ORAL_SOLUTION | Freq: Four times a day (QID) | ORAL | 0 refills | Status: DC | PRN

## 2021-02-02 MED ORDER — ALBUTEROL SULFATE HFA 108 (90 BASE) MCG/ACT IN AERS: 1.0000 | INHALATION_SPRAY | Freq: Four times a day (QID) | RESPIRATORY_TRACT | 0 refills | Status: DC | PRN

## 2021-02-02 NOTE — ED Triage Notes (Signed)
Patient c/o cough, chills, and runny nose that started on Tuesday.  Patient took home covid test and was negative.

## 2021-02-02 NOTE — ED Provider Notes (Addendum)
[facility]    CSN: [id number] Arrival date & time: 02/02/21  1310      History   Chief Complaint Chief Complaint  Patient presents with   Cough    HPI Allison Hobbs is a 55 y.o. female.   HPI  Cough: Pt states that she has had a dry cough, chills, and a runny nose since Tuesday of this week. She denies chest pain, SOB, wheezing. She has taken delsym for symptoms without much relief. She also noted that she has a tendency to get bronchitis with colds without history of asthma or COPD. She reports that she took a home COVID-19 test on Tuesday which was negative. No known sick contacts.   Past Medical History:  Diagnosis Date   Hypertension     There are no problems to display for this patient.   Past Surgical History:  Procedure Laterality Date   ABDOMINAL HYSTERECTOMY     APPENDECTOMY     CHOLECYSTECTOMY     TUBAL LIGATION      OB History   No obstetric history on file.      Home Medications    Prior to Admission medications   Medication Sig Start Date End Date Taking? Authorizing Provider  cetirizine (ZYRTEC) 10 MG tablet Take 10 mg by mouth daily.   Yes [provider]  hydrochlorothiazide (HYDRODIURIL) 25 MG tablet Take 25 mg by mouth daily.   Yes [provider]  Multiple Vitamins-Minerals (MULTIVITAMIN WITH MINERALS) tablet Take 1 tablet by mouth daily.   Yes [provider]  benzonatate (TESSALON) 100 MG capsule Take 2 capsules (200 mg total) by mouth every 8 (eight) hours. 05/16/20   Becky Augusta, NP  promethazine-dextromethorphan (PROMETHAZINE-DM) 6.25-15 MG/5ML syrup Take 5 mLs by mouth 4 (four) times daily as needed. 05/16/20   Becky Augusta, NP    Family History Family History  Problem Relation Age of Onset   Lung cancer Mother    Breast cancer Neg Hx     Social History Social History   Tobacco Use   Smoking status: Never   Smokeless tobacco: Never  Vaping Use   Vaping Use: Never used  Substance  Use Topics   Alcohol use: Yes   Drug use: Never     Allergies   Penicillins   Review of Systems Review of Systems  As stated above in HPI Physical Exam Triage Vital Signs ED Triage Vitals [02/02/21 1329]  Enc Vitals Group     BP      Pulse      Resp      Temp      Temp src      SpO2      Weight 182 lb (82.6 kg)     Height 5\' 5"  (1.651 m)     Head Circumference      Peak Flow      Pain Score 5     Pain Loc      Pain Edu?      Excl. in GC?    No data found.  Updated Vital Signs Ht 5\' 5"  (1.651 m)   Wt 182 lb (82.6 kg)   BMI 30.29 kg/m    Physical Exam Vitals and nursing note reviewed.  Constitutional:      General: She is not in acute distress.    Appearance: Normal appearance. She is not ill-appearing, toxic-appearing or diaphoretic.  HENT:     Head: Normocephalic and atraumatic.     Right Ear:  Tympanic membrane normal.     Left Ear: Tympanic membrane normal.     Nose: Congestion and rhinorrhea present.     Mouth/Throat:     Mouth: Mucous membranes are moist.     Pharynx: Oropharynx is clear. No oropharyngeal exudate or posterior oropharyngeal erythema.  Eyes:     Extraocular Movements: Extraocular movements intact.     Pupils: Pupils are equal, round, and reactive to light.  Cardiovascular:     Rate and Rhythm: Normal rate and regular rhythm.     Heart sounds: Normal heart sounds.  Pulmonary:     Effort: Pulmonary effort is normal.     Breath sounds: Normal breath sounds.     Comments: Wheeze like cough intermittently  Musculoskeletal:     Cervical back: Normal range of motion and neck supple.  Skin:    General: Skin is warm.  Neurological:     Mental Status: She is alert and oriented to person, place, and time.     UC Treatments / Results  Labs (all labs ordered are listed, but only abnormal results are displayed) Labs Reviewed  SARS CORONAVIRUS 2 (TAT 6-24 HRS)    EKG   Radiology No results found.  Procedures Procedures  (including critical care time)  Medications Ordered in UC Medications - No data to display  Initial Impression / Assessment and Plan / UC Course  I have reviewed the triage vital signs and the nursing notes.  Pertinent labs & imaging results that were available during my care of the patient were reviewed by me and considered in my medical decision making (see chart for details).     New. Likely viral in nature. Discussed and described bronchitis with patient which could be the cause of her wheeze like cough with clear lung fields on auscultation. Respiratory panel pending. For now she asks for cough syrup and medication. Will send in along with albuterol. Discussed how to use these medications along with common potential side effects and precautions.  Discussed red flag signs and symptoms.  Rest and hydration encouraged. Follow up with our office or her PCP's office should symptoms fail to improve over the next 5-7 days.  Final Clinical Impressions(s) / UC Diagnoses   Final diagnoses:  None   Discharge Instructions   None    ED Prescriptions   None    PDMP not reviewed this encounter.   Rushie Chestnut, PA-C 02/02/21 1353    [address] PA-C 02/02/21 1357    Rushie Chestnut, PA-C 02/07/21 1717    Rushie Chestnut, PA-C 02/09/21 1700

## 2021-02-03 LAB — SARS CORONAVIRUS 2 (TAT 6-24 HRS): SARS Coronavirus 2: POSITIVE — AB

## 2021-09-23 ENCOUNTER — Other Ambulatory Visit: Payer: Self-pay

## 2021-09-23 ENCOUNTER — Ambulatory Visit
Admission: EM | Admit: 2021-09-23 | Discharge: 2021-09-23 | Disposition: A | Discharge status: Home / Self Care | Payer: 59 | Attending: Emergency Medicine | Admitting: Emergency Medicine

## 2021-09-23 DIAGNOSIS — J069 Acute upper respiratory infection, unspecified: Secondary | ICD-10-CM | POA: Diagnosis not present

## 2021-09-23 DIAGNOSIS — R051 Acute cough: Secondary | ICD-10-CM | POA: Diagnosis not present

## 2021-09-23 DIAGNOSIS — H66001 Acute suppurative otitis media without spontaneous rupture of ear drum, right ear: Secondary | ICD-10-CM | POA: Diagnosis not present

## 2021-09-23 MED ORDER — DOXYCYCLINE HYCLATE 100 MG PO CAPS: 100.0000 mg | ORAL_CAPSULE | Freq: Two times a day (BID) | ORAL | 0 refills | Status: AC

## 2021-09-23 MED ORDER — IPRATROPIUM BROMIDE 0.06 % NA SOLN: 2.0000 | Freq: Four times a day (QID) | NASAL | 12 refills | Status: DC

## 2021-09-23 MED ORDER — PROMETHAZINE-DM 6.25-15 MG/5ML PO SYRP: 5.0000 mL | ORAL_SOLUTION | Freq: Four times a day (QID) | ORAL | 0 refills | Status: DC | PRN

## 2021-09-23 MED ORDER — BENZONATATE 100 MG PO CAPS: 200.0000 mg | ORAL_CAPSULE | Freq: Three times a day (TID) | ORAL | 0 refills | Status: DC

## 2021-09-23 NOTE — ED Triage Notes (Signed)
Pt here with C/O cough, and diarrhea since Tuesday. Pt states she isn't able to sleep from coughing so much, feels like she is being shocked when she is coughing.

## 2021-09-23 NOTE — Discharge Instructions (Signed)
Take the Doxycyline twice daily for 10 days with food for treatment of your ear infection.  Take an over-the-counter probiotic 1 hour after each dose of antibiotic to prevent diarrhea.  Use over-the-counter Tylenol and ibuprofen as needed for pain or fever.  Place a hot water bottle, or heating pad, underneath your pillowcase at night to help dilate up your ear and aid in pain relief as well as resolution of the infection.  Use the Atrovent nasal spray, 2 squirts in each nostril every 6 hours, as needed for runny nose and postnasal drip.  Use the Tessalon Perles every 8 hours during the day.  Take them with a small sip of water.  They may give you some numbness to the base of your tongue or a metallic taste in your mouth, this is normal.  Use the Promethazine DM cough syrup at bedtime for cough and congestion.  It will make you drowsy so do not take it during the day.  Return for reevaluation for any new or worsening symptoms.

## 2021-09-23 NOTE — ED Provider Notes (Signed)
[facility]    CSN: [id number] Arrival date & time: 09/23/21  0805      History   Chief Complaint Chief Complaint  Patient presents with   Cough    HPI AVELEEN NEVERS is a 56 y.o. female.   HPI  56 year old female here for evaluation of respiratory and GI complaints.  Patient reports that she has been experiencing a significant cough that is causing her pain in her chest and causing her to gag and vomit for the past 5 days.  Last evening she developed some nausea, vomiting, and diarrhea.  She also endorses sneezing.  She states her cough is intermittently productive for a clear sputum and her nasal discharge is clear.  She also endorses a sore throat, shortness of breath, and wheezing.  She has been using over-the-counter Robitussin and Alka-Seltzer without any relief of symptoms.  She also describes some sensitivity in her right ear that started this morning.  Past Medical History:  Diagnosis Date   Hypertension     There are no problems to display for this patient.   Past Surgical History:  Procedure Laterality Date   ABDOMINAL HYSTERECTOMY     APPENDECTOMY     CHOLECYSTECTOMY     TUBAL LIGATION      OB History   No obstetric history on file.      Home Medications    Prior to Admission medications   Medication Sig Start Date End Date Taking? Authorizing Provider  benzonatate (TESSALON) 100 MG capsule Take 2 capsules (200 mg total) by mouth every 8 (eight) hours. 09/23/21  Yes Becky Augusta, NP  cetirizine (ZYRTEC) 10 MG tablet Take 10 mg by mouth daily.   Yes [provider]  doxycycline (VIBRAMYCIN) 100 MG capsule Take 1 capsule (100 mg total) by mouth 2 (two) times daily for 7 days. 09/23/21 09/30/21 Yes Becky Augusta, NP  hydrochlorothiazide (HYDRODIURIL) 25 MG tablet Take 25 mg by mouth daily.   Yes [provider]  ipratropium (ATROVENT) 0.06 % nasal spray Place 2 sprays into both nostrils 4 (four) times daily. 09/23/21  Yes  Becky Augusta, NP  Multiple Vitamins-Minerals (MULTIVITAMIN WITH MINERALS) tablet Take 1 tablet by mouth daily.   Yes [provider]  promethazine-dextromethorphan (PROMETHAZINE-DM) 6.25-15 MG/5ML syrup Take 5 mLs by mouth 4 (four) times daily as needed. 09/23/21  Yes Becky Augusta, NP    Family History Family History  Problem Relation Age of Onset   Lung cancer Mother    Breast cancer Neg Hx     Social History Social History   Tobacco Use   Smoking status: Never   Smokeless tobacco: Never  Vaping Use   Vaping Use: Never used  Substance Use Topics   Alcohol use: Yes   Drug use: Never     Allergies   Penicillins   Review of Systems Review of Systems  Constitutional:  Negative for fever.  HENT:  Positive for congestion, ear pain, rhinorrhea and sore throat. Negative for ear discharge.   Respiratory:  Positive for cough, shortness of breath and wheezing.   Gastrointestinal:  Positive for diarrhea, nausea and vomiting.  Skin:  Negative for rash.  Hematological: Negative.   Psychiatric/Behavioral: Negative.      Physical Exam Triage Vital Signs ED Triage Vitals  Enc Vitals Group     BP 09/23/21 0814 127/82     Pulse Rate 09/23/21 0814 84     Resp 09/23/21 0814 18     Temp 09/23/21 7829  99.2 F (37.3 C)     Temp Source 09/23/21 0814 Oral     SpO2 09/23/21 0814 96 %     Weight 09/23/21 0813 183 lb (83 kg)     Height 09/23/21 0813 5\' 5"  (1.651 m)     Head Circumference --      Peak Flow --      Pain Score 09/23/21 0813 0     Pain Loc --      Pain Edu? --      Excl. in GC? --    No data found.  Updated Vital Signs BP 127/82 (BP Location: Right Arm)    Pulse 84    Temp 99.2 F (37.3 C) (Oral)    Resp 18    Ht 5\' 5"  (1.651 m)    Wt 183 lb (83 kg)    SpO2 96%    BMI 30.45 kg/m   Visual Acuity Right Eye Distance:   Left Eye Distance:   Bilateral Distance:    Right Eye Near:   Left Eye Near:    Bilateral Near:     Physical Exam Vitals and nursing  note reviewed.  Constitutional:      Appearance: Normal appearance. She is not ill-appearing.  HENT:     Head: Normocephalic and atraumatic.     Right Ear: Ear canal and external ear normal. There is no impacted cerumen.     Left Ear: Tympanic membrane, ear canal and external ear normal. There is no impacted cerumen.     Nose: Congestion and rhinorrhea present.     Mouth/Throat:     Mouth: Mucous membranes are moist.     Pharynx: Oropharynx is clear. Posterior oropharyngeal erythema present.  Cardiovascular:     Rate and Rhythm: Normal rate and regular rhythm.     Pulses: Normal pulses.     Heart sounds: Normal heart sounds. No murmur heard.   No friction rub. No gallop.  Pulmonary:     Effort: Pulmonary effort is normal.     Breath sounds: Normal breath sounds. No wheezing, rhonchi or rales.  Musculoskeletal:     Cervical back: Normal range of motion and neck supple.  Lymphadenopathy:     Cervical: No cervical adenopathy.  Skin:    General: Skin is warm and dry.     Capillary Refill: Capillary refill takes less than 2 seconds.     Findings: No erythema or rash.  Neurological:     General: No focal deficit present.     Mental Status: She is alert and oriented to person, place, and time.  Psychiatric:        Mood and Affect: Mood normal.        Behavior: Behavior normal.        Thought Content: Thought content normal.        Judgment: Judgment normal.     UC Treatments / Results  Labs (all labs ordered are listed, but only abnormal results are displayed) Labs Reviewed - No data to display  EKG   Radiology No results found.  Procedures Procedures (including critical care time)  Medications Ordered in UC Medications - No data to display  Initial Impression / Assessment and Plan / UC Course  I have reviewed the triage vital signs and the nursing notes.  Pertinent labs & imaging results that were available during my care of the patient were reviewed by me and  considered in my medical decision making (see chart for details).  Patient very  pleasant, nontoxic-appearing 56 year old female here for evaluation of respiratory and GI issues that been going on for approximately 5 days.  Respiratory symptoms started first in the GI symptoms started last night.  Patient reports that she has a "awful cough" and she does demonstrate a mild cough in the exam room.  Her physical exam reveals a pearly gray tympanic membrane on the left with normal light reflex and clear external auditory canals.  The right tympanic membrane is erythematous and injected with some mild bulging.  The right external auditory canal is clear.  Nasal mucosa is erythematous and edematous with clear discharge in both nares.  Oropharyngeal exam reveals mild posterior oropharyngeal erythema with clear postnasal drip.  No tonsillar hypertrophy, edema, erythema, or exudate noted.  No cervical lymphadenopathy appreciated exam.  Cardiopulmonary exam reveals clear lung sounds in all fields.  Patient Damas consistent with a URI with a cough and otitis media.  I will treat the otitis media with doxycycline as patient is allergic to penicillin and I will give her Atrovent nasal spray to help with nasal congestion and postnasal drip which is feeding her cough.  She also reports good results in the past from [facility] and Promethazine DM cough syrup so I will prescribe both of those as well.  Return precautions reviewed with patient.  Work note provided.   Final Clinical Impressions(s) / UC Diagnoses   Final diagnoses:  Upper respiratory tract infection, unspecified type  Acute cough  Non-recurrent acute suppurative otitis media of right ear without spontaneous rupture of tympanic membrane     Discharge Instructions      Take the Doxycyline twice daily for 10 days with food for treatment of your ear infection.  Take an over-the-counter probiotic 1 hour after each dose of antibiotic to prevent  diarrhea.  Use over-the-counter Tylenol and ibuprofen as needed for pain or fever.  Place a hot water bottle, or heating pad, underneath your pillowcase at night to help dilate up your ear and aid in pain relief as well as resolution of the infection.  Use the Atrovent nasal spray, 2 squirts in each nostril every 6 hours, as needed for runny nose and postnasal drip.  Use the Tessalon Perles every 8 hours during the day.  Take them with a small sip of water.  They may give you some numbness to the base of your tongue or a metallic taste in your mouth, this is normal.  Use the Promethazine DM cough syrup at bedtime for cough and congestion.  It will make you drowsy so do not take it during the day.  Return for reevaluation for any new or worsening symptoms.      ED Prescriptions     Medication Sig Dispense Auth. Provider   benzonatate (TESSALON) 100 MG capsule Take 2 capsules (200 mg total) by mouth every 8 (eight) hours. 21 capsule Becky Augusta, NP   ipratropium (ATROVENT) 0.06 % nasal spray Place 2 sprays into both nostrils 4 (four) times daily. 15 mL Becky Augusta, NP   doxycycline (VIBRAMYCIN) 100 MG capsule Take 1 capsule (100 mg total) by mouth 2 (two) times daily for 7 days. 14 capsule Becky Augusta, NP   promethazine-dextromethorphan (PROMETHAZINE-DM) 6.25-15 MG/5ML syrup Take 5 mLs by mouth 4 (four) times daily as needed. 118 mL Becky Augusta, NP      PDMP not reviewed this encounter.   Becky Augusta, NP 09/23/21 [phone]

## 2021-10-17 ENCOUNTER — Other Ambulatory Visit: Payer: Self-pay | Admitting: Pediatrics

## 2021-10-17 DIAGNOSIS — Z1231 Encounter for screening mammogram for malignant neoplasm of breast: Secondary | ICD-10-CM

## 2021-11-28 ENCOUNTER — Ambulatory Visit
Admission: RE | Admit: 2021-11-28 | Discharge: 2021-11-28 | Disposition: A | Discharge status: Home / Self Care | Payer: 59 | Source: Ambulatory Visit | Attending: Pediatrics | Admitting: Pediatrics

## 2021-11-28 DIAGNOSIS — Z1231 Encounter for screening mammogram for malignant neoplasm of breast: Secondary | ICD-10-CM | POA: Diagnosis not present

## 2022-10-17 ENCOUNTER — Other Ambulatory Visit: Payer: Self-pay

## 2022-10-17 DIAGNOSIS — Z1231 Encounter for screening mammogram for malignant neoplasm of breast: Secondary | ICD-10-CM

## 2022-12-26 ENCOUNTER — Ambulatory Visit
Admission: RE | Admit: 2022-12-26 | Discharge: 2022-12-26 | Disposition: A | Discharge status: Home / Self Care | Payer: 59 | Source: Ambulatory Visit | Attending: Pediatrics | Admitting: Pediatrics

## 2022-12-26 DIAGNOSIS — Z1231 Encounter for screening mammogram for malignant neoplasm of breast: Secondary | ICD-10-CM | POA: Insufficient documentation

## 2023-01-27 ENCOUNTER — Other Ambulatory Visit: Payer: Self-pay

## 2023-01-27 ENCOUNTER — Emergency Department: Payer: 59

## 2023-01-27 ENCOUNTER — Emergency Department
Admission: EM | Admit: 2023-01-27 | Discharge: 2023-01-27 | Disposition: A | Discharge status: Home / Self Care | Payer: 59 | Attending: Emergency Medicine | Admitting: Emergency Medicine

## 2023-01-27 DIAGNOSIS — R202 Paresthesia of skin: Secondary | ICD-10-CM | POA: Insufficient documentation

## 2023-01-27 DIAGNOSIS — R42 Dizziness and giddiness: Secondary | ICD-10-CM | POA: Diagnosis present

## 2023-01-27 DIAGNOSIS — I10 Essential (primary) hypertension: Secondary | ICD-10-CM | POA: Insufficient documentation

## 2023-01-27 DIAGNOSIS — R200 Anesthesia of skin: Secondary | ICD-10-CM

## 2023-01-27 DIAGNOSIS — E236 Other disorders of pituitary gland: Secondary | ICD-10-CM | POA: Insufficient documentation

## 2023-01-27 LAB — COMPREHENSIVE METABOLIC PANEL
ALT: 24 U/L (ref 0–44)
AST: 30 U/L (ref 15–41)
Albumin: 4.3 g/dL (ref 3.5–5.0)
Alkaline Phosphatase: 120 U/L (ref 38–126)
Anion gap: 9 (ref 5–15)
BUN: 13 mg/dL (ref 6–20)
CO2: 26 mmol/L (ref 22–32)
Calcium: 9.2 mg/dL (ref 8.9–10.3)
Chloride: 103 mmol/L (ref 98–111)
Creatinine, Ser: 0.7 mg/dL (ref 0.44–1.00)
GFR, Estimated: >60 mL/min (ref >60)
Glucose, Bld: 79 mg/dL (ref 70–99)
Potassium: 3.1 mmol/L — ABNORMAL LOW (ref 3.5–5.1)
Sodium: 138 mmol/L (ref 135–145)
Total Bilirubin: 0.8 mg/dL (ref 0.3–1.2)
Total Protein: 8.1 g/dL (ref 6.5–8.1)

## 2023-01-27 LAB — DIFFERENTIAL
Abs Immature Granulocytes: 0.02 10*3/uL (ref 0.00–0.07)
Basophils Absolute: 0.1 10*3/uL (ref 0.0–0.1)
Basophils Relative: 1 %
Eosinophils Absolute: 0.4 10*3/uL (ref 0.0–0.5)
Eosinophils Relative: 8 %
Immature Granulocytes: 0 %
Lymphocytes Relative: 43 %
Lymphs Abs: 2.4 10*3/uL (ref 0.7–4.0)
Monocytes Absolute: 0.2 10*3/uL (ref 0.1–1.0)
Monocytes Relative: 4 %
Neutro Abs: 2.4 10*3/uL (ref 1.7–7.7)
Neutrophils Relative %: 44 %

## 2023-01-27 LAB — APTT: aPTT: 22 seconds — ABNORMAL LOW (ref 24–36)

## 2023-01-27 LAB — CBC
HCT: 41 % (ref 36.0–46.0)
Hemoglobin: 13.1 g/dL (ref 12.0–15.0)
MCH: 27.8 pg (ref 26.0–34.0)
MCHC: 32 g/dL (ref 30.0–36.0)
MCV: 86.9 fL (ref 80.0–100.0)
Platelets: 236 10*3/uL (ref 150–400)
RBC: 4.72 MIL/uL (ref 3.87–5.11)
RDW: 13.6 % (ref 11.5–15.5)
WBC: 5.5 10*3/uL (ref 4.0–10.5)
nRBC: 0 % (ref 0.0–0.2)

## 2023-01-27 LAB — T4, FREE: Free T4: 0.89 ng/dL (ref 0.61–1.12)

## 2023-01-27 LAB — TSH: TSH: 1.936 u[IU]/mL (ref 0.350–4.500)

## 2023-01-27 LAB — PROTIME-INR
INR: 1 (ref 0.8–1.2)
Prothrombin Time: 13.9 seconds (ref 11.4–15.2)

## 2023-01-27 LAB — ETHANOL: Alcohol, Ethyl (B): <10 mg/dL (ref <10)

## 2023-01-27 MED ORDER — MECLIZINE HCL 25 MG PO TABS: 25.0000 mg | ORAL_TABLET | Freq: Three times a day (TID) | ORAL | 0 refills | Status: AC | PRN

## 2023-01-27 MED ORDER — SODIUM CHLORIDE 0.9% FLUSH
3.0000 mL | Freq: Once | INTRAVENOUS | Status: AC
  Administered 2023-01-27: 3 mL via INTRAVENOUS

## 2023-01-27 MED ORDER — MECLIZINE HCL 25 MG PO TABS
25.0000 mg | ORAL_TABLET | Freq: Once | ORAL | Status: AC
  Administered 2023-01-27: 25 mg via ORAL
  Filled 2023-01-27: qty 1

## 2023-01-27 MED ORDER — SODIUM CHLORIDE 0.9 % IV BOLUS
1000.0000 mL | Freq: Once | INTRAVENOUS | Status: AC
  Administered 2023-01-27: 1000 mL via INTRAVENOUS

## 2023-01-27 MED ORDER — POTASSIUM CHLORIDE CRYS ER 20 MEQ PO TBCR
40.0000 meq | EXTENDED_RELEASE_TABLET | Freq: Once | ORAL | Status: AC
  Administered 2023-01-27: 40 meq via ORAL
  Filled 2023-01-27: qty 2

## 2023-01-27 NOTE — Discharge Instructions (Addendum)
Fortunately your testing in the emergency department did not show any signs of emergencies like stroke or bleeding inside of the brain to explain your symptoms.  Please talk to your doctor to follow-up on the lab testing that I ordered from the emergency department that may be helpful in determining whether your enlarged pituitary gland needs further testing or specialist evaluation.  Take meclizine as prescribed and drink plenty of fluids stay well-hydrated.  Follow-up with your primary doctor this week, and call ENT doctor for potential ear related causes of your dizziness if your symptoms do not get better in 5 to 7 days.

## 2023-01-27 NOTE — ED Provider Notes (Signed)
[facility] Provider Note    Event Date/Time   First MD Initiated Contact with Patient 01/27/23 [phone]     (approximate)   History   Dizziness   HPI  Allison Hobbs is a 57 y.o. female   Past medical history of hypertension, GERD, status post hysterectomy, appendectomy, cholecystectomy here with dizziness since waking up this morning.  Also has had tingling in her right hand that started yesterday.  She went to bed around 9:30 PM and awoke feeling dizziness sensation of room spinning upon sitting up in bed this morning.  Sensation continued, denies visual changes or headache or neck pain.  She has never had this happen before.  Yesterday she did have some tingling and numbness in her right hand and right arm that has mostly resolved by now.  This started around midday when she was writing a letter.  She denies any trauma to the arm, and does not recall the distribution of the sensations.  She has had a mild cough, chronic in nature over the last several months without fevers, chest pain, shortness of breath.   External Medical Documents Reviewed: Family medicine visit dated March 2024 for hypertension and borderline diabetes and seasonal allergies/rhinitis      Physical Exam   Triage Vital Signs: ED Triage Vitals  Enc Vitals Group     BP 01/27/23 0838 120/77     Pulse Rate 01/27/23 0838 76     Resp 01/27/23 0838 16     Temp 01/27/23 0842 98.3 F (36.8 C)     Temp src --      SpO2 01/27/23 0838 100 %     Weight 01/27/23 0838 187 lb (84.8 kg)     Height 01/27/23 0838 5\' 5"  (1.651 m)     Head Circumference --      Peak Flow --      Pain Score 01/27/23 0838 0     Pain Loc --      Pain Edu? --      Excl. in GC? --     Most recent vital signs: Vitals:   01/27/23 0842 01/27/23 1210  BP:  130/87  Pulse:  70  Resp:  16  Temp: 98.3 F (36.8 C)   SpO2:  99%    General: Awake, no distress.  CV:  Good peripheral perfusion.  Resp:  Normal  effort.  Abd:  No distention.  Other:  Awake comfortable appearing normal vital signs sitting in the bed pleasant and cooperative.  No elicited nystagmus, though upon sitting up and moving her head the sensation of dizziness is reproduced.  She has no facial asymmetry, no dysarthria and a normal finger-to-nose and gait.  She has no motor or sensory deficits and states that the sensation is normal now to her right upper extremity.  Her lungs are clear and her abdomen is soft and nontender.   ED Results / Procedures / Treatments   Labs (all labs ordered are listed, but only abnormal results are displayed) Labs Reviewed  APTT - Abnormal; Notable for the following components:      Result Value   aPTT 22 (*)    All other components within normal limits  COMPREHENSIVE METABOLIC PANEL - Abnormal; Notable for the following components:   Potassium 3.1 (*)    All other components within normal limits  PROTIME-INR  CBC  DIFFERENTIAL  ETHANOL  TSH  T4, FREE  CORTISOL  PROLACTIN     I ordered  and reviewed the above labs they are notable for potassium slightly low, oral repletion ordered  EKG  ED ECG REPORT I, Pilar Jarvis, the attending physician, personally viewed and interpreted this ECG.   Date: 01/27/2023  EKG Time: 0842  Rate: 76  Rhythm: sinus  Axis: nl  Intervals:none, +PVC  ST&T Change: no stemi    RADIOLOGY I independently reviewed and interpreted CT scan of the head and I see no obvious bleeding or midline shift   PROCEDURES:  Critical Care performed: No  Procedures   MEDICATIONS ORDERED IN ED: Medications  sodium chloride flush (NS) 0.9 % injection 3 mL (3 mLs Intravenous Given 01/27/23 1022)  meclizine (ANTIVERT) tablet 25 mg (25 mg Oral Given 01/27/23 0929)  sodium chloride 0.9 % bolus 1,000 mL (1,000 mLs Intravenous New Bag/Given 01/27/23 1129)  potassium chloride SA (KLOR-CON M) CR tablet 40 mEq (40 mEq Oral Given 01/27/23 1208)     IMPRESSION / MDM /  ASSESSMENT AND PLAN / ED COURSE  I reviewed the triage vital signs and the nursing notes.                                Patient's presentation is most consistent with acute presentation with potential threat to life or bodily function.  Differential diagnosis includes, but is not limited to, central versus peripheral vertigo, CVA, electrolyte derangement, peripheral neuropathy   The patient is on the cardiac monitor to evaluate for evidence of arrhythmia and/or significant heart rate changes.  MDM:    This patient with multiple neurologic symptoms over the past 24 hours but last known normal is approximately 12 hours or 24 hours ago so outside of the thrombolytic window.  Low suspicion for CVA or central process and more likely benign positional vertigo given her symptomatology worse with movement and with a benign nonfocal neurologic exam as above.  More likely her right upper extremity numbness/tingling was peripheral in nature due to her writing and perhaps some compression of the peripheral nerves during that time, now patient states nearly completely resolved some ongoing tingling though no sensory deficits on my exam.  Given her risk factors, multiple neurologic symptoms, I considered CVA and we will proceed with an MRI of the brain to further assess for this potential diagnosis.  Considered ACS though I think this is less likely given no chest pain, and I considered dissection and I think this is less likely given no neck or head pain.    Will trial meclizine and fluids as well.  Check basic labs, EKG in case of dysrhythmia or electrolyte disturbances.  I anticipate discharge if the MRI is negative.   -- Patient feels better after treatment.  MRI negative for acute stroke.  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  I sent off some lab testing that may help with PMD follow-up and specialist referral as needed.  Thyroid studies  within normal limits, prolactin and cortisol pending for which she will follow-up with PMD.  Given negative evaluation for emergent pathologies as above, and patient feels asymptomatic now, will discharge with close PMD follow-up.       FINAL CLINICAL IMPRESSION(S) / ED DIAGNOSES   Final diagnoses:  Vertigo  Numbness and tingling of right arm  Pituitary gland enlarged (HCC)     Rx / DC Orders   ED Discharge Orders     None  Note:  This document was prepared using Dragon voice recognition software and may include unintentional dictation errors.    Pilar Jarvis, MD 01/27/23 [phone]

## 2023-01-27 NOTE — ED Triage Notes (Signed)
Pt reports waking up with dizziness and feeling like walking sideways. Also reports tingling to right hand that started yesterday. States "I just dont feel right:" Ambulatory to treatment room Clear speech

## 2023-01-27 NOTE — ED Notes (Signed)
Patient transported to MRI 

## 2023-02-07 ENCOUNTER — Other Ambulatory Visit: Payer: Self-pay | Admitting: Pediatrics

## 2023-02-07 DIAGNOSIS — E236 Other disorders of pituitary gland: Secondary | ICD-10-CM

## 2023-02-10 ENCOUNTER — Ambulatory Visit
Admission: RE | Admit: 2023-02-10 | Discharge: 2023-02-10 | Disposition: A | Discharge status: Home / Self Care | Payer: 59 | Source: Ambulatory Visit | Attending: Pediatrics | Admitting: Pediatrics

## 2023-02-10 DIAGNOSIS — E236 Other disorders of pituitary gland: Secondary | ICD-10-CM | POA: Diagnosis present

## 2023-02-10 MED ORDER — GADOBUTROL 1 MMOL/ML IV SOLN
7.0000 mL | Freq: Once | INTRAVENOUS | Status: AC | PRN
  Administered 2023-02-10: 7 mL via INTRAVENOUS

## 2023-07-31 ENCOUNTER — Ambulatory Visit
Admission: EM | Admit: 2023-07-31 | Discharge: 2023-07-31 | Disposition: A | Discharge status: Home / Self Care | Payer: BC Managed Care – PPO | Attending: Family Medicine | Admitting: Family Medicine

## 2023-07-31 ENCOUNTER — Ambulatory Visit: Payer: BC Managed Care – PPO

## 2023-07-31 DIAGNOSIS — J989 Respiratory disorder, unspecified: Secondary | ICD-10-CM | POA: Diagnosis present

## 2023-07-31 DIAGNOSIS — R509 Fever, unspecified: Secondary | ICD-10-CM | POA: Diagnosis present

## 2023-07-31 DIAGNOSIS — J111 Influenza due to unidentified influenza virus with other respiratory manifestations: Secondary | ICD-10-CM | POA: Insufficient documentation

## 2023-07-31 LAB — GROUP A STREP BY PCR: Group A Strep by PCR: NOT DETECTED

## 2023-07-31 MED ORDER — BENZONATATE 100 MG PO CAPS: 200.0000 mg | ORAL_CAPSULE | Freq: Three times a day (TID) | ORAL | 0 refills | Status: DC

## 2023-07-31 MED ORDER — PROMETHAZINE-DM 6.25-15 MG/5ML PO SYRP: 5.0000 mL | ORAL_SOLUTION | Freq: Four times a day (QID) | ORAL | 0 refills | Status: DC | PRN

## 2023-07-31 NOTE — ED Triage Notes (Signed)
 Pt c/o cough, sore throat, body aches, diarrhea x1week

## 2023-07-31 NOTE — Discharge Instructions (Signed)
 You can take Tylenol and/or Ibuprofen as needed for fever reduction and pain relief.    For cough: honey 1/2 to 1 teaspoon (you can dilute the honey in water or another fluid).  You can also use guaifenesin and dextromethorphan for cough. You can use a humidifier for chest congestion and cough.  If you don't have a humidifier, you can sit in the bathroom with the hot shower running.      For sore throat: try warm salt water gargles, Mucinex sore throat cough drops or cepacol lozenges, throat spray, warm tea or water with lemon/honey, popsicles or ice, or OTC cold relief medicine for throat discomfort. You can also purchase chloraseptic spray at the pharmacy or dollar store.   For congestion: take a daily anti-histamine like Zyrtec, Claritin, and a oral decongestant, such as pseudoephedrine.  You can also use Flonase 1-2 sprays in each nostril daily. Afrin is also a good option, if you do not have high blood pressure.    It is important to stay hydrated: drink plenty of fluids (water, gatorade/powerade/pedialyte, juices, or teas) to keep your throat moisturized and help further relieve irritation/discomfort.    Return or go to the Emergency Department if symptoms worsen or do not improve in the next few days

## 2023-07-31 NOTE — ED Provider Notes (Signed)
 [facility]    CSN: [id number] Arrival date & time: 07/31/23  1518      History   Chief Complaint Chief Complaint  Patient presents with   Cough   Diarrhea    HPI PAISLEA HATTON is a 58 y.o. female.   HPI  History obtained from the patient. Glyn presents for sore throat, cough that has gotten worse in the past 2 days but symptoms started about a week ago.  Diarrhea, chills, body aches, wheezing.  Has been taking over-the-counter cough medicines without relief.  No documented fever at home.       Past Medical History:  Diagnosis Date   Hypertension     There are no active problems to display for this patient.   Past Surgical History:  Procedure Laterality Date   ABDOMINAL HYSTERECTOMY     APPENDECTOMY     CHOLECYSTECTOMY     TUBAL LIGATION      OB History   No obstetric history on file.      Home Medications    Prior to Admission medications   Medication Sig Start Date End Date Taking? Authorizing Provider  cetirizine (ZYRTEC) 10 MG tablet Take 10 mg by mouth daily.   Yes [provider]  hydrochlorothiazide (HYDRODIURIL) 25 MG tablet Take 25 mg by mouth daily.   Yes [provider]  Multiple Vitamins-Minerals (MULTIVITAMIN WITH MINERALS) tablet Take 1 tablet by mouth daily.   Yes [provider]  benzonatate  (TESSALON ) 100 MG capsule Take 2 capsules (200 mg total) by mouth every 8 (eight) hours. 07/31/23   Santonio Speakman, DO  ipratropium (ATROVENT ) 0.06 % nasal spray Place 2 sprays into both nostrils 4 (four) times daily. 09/23/21   Bernardino Ditch, NP  meclizine  (ANTIVERT ) 25 MG tablet Take 1 tablet (25 mg total) by mouth 3 (three) times daily as needed for dizziness. 01/27/23   Cyrena Mylar, MD  promethazine -dextromethorphan (PROMETHAZINE -DM) 6.25-15 MG/5ML syrup Take 5 mLs by mouth 4 (four) times daily as needed. 07/31/23   Timea Breed, DO    Family History Family History  Problem Relation Age of Onset    Lung cancer Mother    Breast cancer Neg Hx     Social History Social History   Tobacco Use   Smoking status: Never   Smokeless tobacco: Never  Vaping Use   Vaping status: Never Used  Substance Use Topics   Alcohol use: Yes   Drug use: Never     Allergies   Penicillins   Review of Systems Review of Systems: negative unless otherwise stated in HPI.      Physical Exam Triage Vital Signs ED Triage Vitals  Encounter Vitals Group     BP 07/31/23 1701 124/86     Systolic BP Percentile --      Diastolic BP Percentile --      Pulse Rate 07/31/23 1701 (!) 101     Resp --      Temp 07/31/23 1701 100.1 F (37.8 C)     Temp Source 07/31/23 1701 Oral     SpO2 07/31/23 1701 98 %     Weight 07/31/23 1700 184 lb (83.5 kg)     Height 07/31/23 1700 5' 5 (1.651 m)     Head Circumference --      Peak Flow --      Pain Score 07/31/23 1659 5     Pain Loc --      Pain Education --  Exclude from Growth Chart --    No data found.  Updated Vital Signs BP 124/86 (BP Location: Left Arm)   Pulse (!) 101   Temp 100.1 F (37.8 C) (Oral)   Ht 5' 5 (1.651 m)   Wt 83.5 kg   SpO2 98%   BMI 30.62 kg/m   Visual Acuity Right Eye Distance:   Left Eye Distance:   Bilateral Distance:    Right Eye Near:   Left Eye Near:    Bilateral Near:     Physical Exam GEN:     alert, non-toxic appearing female in no distress    HENT:  mucus membranes moist, oropharyngeal without lesions,mild erythema, no tonsillar hypertrophy or exudates, clear nasal discharge, bilateral TM normal EYES:   no scleral injection or discharge NECK:  normal ROM, no meningismus   RESP:  no increased work of breathing, faint expiratory wheeze, no rales or rhonchi CVS:   regular rate and rhythm Skin:   warm and dry    UC Treatments / Results  Labs (all labs ordered are listed, but only abnormal results are displayed) Labs Reviewed  GROUP A STREP BY PCR    EKG   Radiology No results  found.  Procedures Procedures (including critical care time)  Medications Ordered in UC Medications - No data to display  Initial Impression / Assessment and Plan / UC Course  I have reviewed the triage vital signs and the nursing notes.  Pertinent labs & imaging results that were available during my care of the patient were reviewed by me and considered in my medical decision making (see chart for details).       Pt is a 58 y.o. female who presents for 6 days of influenza-like symptoms.  Retal has an elevated temperature here of 100.1 F. Satting well on room air. Overall pt is non-toxic appearing, well hydrated, without respiratory distress. Pulmonary exam is remarkable for faint expiratory wheeze.  Recommended chest x-ray but patient declined.  COVID and influenza test deferred due to duration of symptoms.  Strep PCR testing obtained and was negative.   History most consistent with viral respiratory illness. Discussed symptomatic treatment.  Explained lack of efficacy of antibiotics in viral disease.  Typical duration of symptoms discussed.  Promethazine  DM for cough.  Tessalon  Perles as well.  Work note provided  Return and ED precautions given and voiced understanding. Discussed MDM, treatment plan and plan for follow-up with patient who agrees with plan.     Final Clinical Impressions(s) / UC Diagnoses   Final diagnoses:  Respiratory illness with fever  Influenza-like illness     Discharge Instructions      You can take Tylenol and/or Ibuprofen as needed for fever reduction and pain relief.    For cough: honey 1/2 to 1 teaspoon (you can dilute the honey in water or another fluid).  You can also use guaifenesin and dextromethorphan for cough. You can use a humidifier for chest congestion and cough.  If you don't have a humidifier, you can sit in the bathroom with the hot shower running.      For sore throat: try warm salt water gargles, Mucinex sore throat cough drops  or cepacol lozenges, throat spray, warm tea or water with lemon/honey, popsicles or ice, or OTC cold relief medicine for throat discomfort. You can also purchase chloraseptic spray at the pharmacy or dollar store.   For congestion: take a daily anti-histamine like Zyrtec, Claritin, and a oral decongestant, such as pseudoephedrine.  You can also use Flonase 1-2 sprays in each nostril daily. Afrin is also a good option, if you do not have high blood pressure.    It is important to stay hydrated: drink plenty of fluids (water, gatorade/powerade/pedialyte, juices, or teas) to keep your throat moisturized and help further relieve irritation/discomfort.    Return or go to the Emergency Department if symptoms worsen or do not improve in the next few days      ED Prescriptions     Medication Sig Dispense Auth. Provider   benzonatate  (TESSALON ) 100 MG capsule Take 2 capsules (200 mg total) by mouth every 8 (eight) hours. 21 capsule Nikki Rusnak, DO   promethazine -dextromethorphan (PROMETHAZINE -DM) 6.25-15 MG/5ML syrup Take 5 mLs by mouth 4 (four) times daily as needed. 118 mL Benedetta Sundstrom, DO      PDMP not reviewed this encounter.   Kriste Berth, DO 07/31/23 1754

## 2023-08-04 ENCOUNTER — Ambulatory Visit
Admission: EM | Admit: 2023-08-04 | Discharge: 2023-08-04 | Disposition: A | Discharge status: Home / Self Care | Payer: BC Managed Care – PPO | Attending: Family Medicine | Admitting: Family Medicine

## 2023-08-04 ENCOUNTER — Ambulatory Visit (INDEPENDENT_AMBULATORY_CARE_PROVIDER_SITE_OTHER): Payer: BC Managed Care – PPO

## 2023-08-04 DIAGNOSIS — J40 Bronchitis, not specified as acute or chronic: Secondary | ICD-10-CM

## 2023-08-04 DIAGNOSIS — R109 Unspecified abdominal pain: Secondary | ICD-10-CM | POA: Diagnosis present

## 2023-08-04 DIAGNOSIS — R051 Acute cough: Secondary | ICD-10-CM

## 2023-08-04 LAB — URINALYSIS, W/ REFLEX TO CULTURE (INFECTION SUSPECTED)
Bilirubin Urine: NEGATIVE
Glucose, UA: NEGATIVE mg/dL
Hgb urine dipstick: NEGATIVE
Ketones, ur: NEGATIVE mg/dL
Leukocytes,Ua: NEGATIVE
Nitrite: NEGATIVE
Protein, ur: 30 mg/dL — AB
Specific Gravity, Urine: 1.025 (ref 1.005–1.030)
pH: 5.5 (ref 5.0–8.0)

## 2023-08-04 MED ORDER — PREDNISONE 10 MG (21) PO TBPK: ORAL_TABLET | Freq: Every day | ORAL | 0 refills | Status: DC

## 2023-08-04 MED ORDER — HYDROCOD POLI-CHLORPHE POLI ER 10-8 MG/5ML PO SUER: 5.0000 mL | Freq: Two times a day (BID) | ORAL | 0 refills | Status: DC | PRN

## 2023-08-04 MED ORDER — AZITHROMYCIN 250 MG PO TABS: ORAL_TABLET | ORAL | 0 refills | Status: DC

## 2023-08-04 NOTE — ED Triage Notes (Signed)
 Pt c/o R flank pain & cough x4 days. Was seen on 1/2 for the same issue. Was given tessalon & promethazine w/o relief.

## 2023-08-04 NOTE — ED Provider Notes (Signed)
 [facility]    CSN: [id number] Arrival date & time: 08/04/23  1402      History   Chief Complaint Chief Complaint  Patient presents with   Flank Pain   Cough    HPI Allison Hobbs is a 58 y.o. female.   HPI  History obtained from the patient. Allison Hobbs presents for right flank pain with cough for the past 4 days.  Has more chest congestion now and feels like it settling in her chest.  Has felt warm but no documented fevers though she has been taking over-the-counter antipyretics around-the-clock.  She has been taking the Tessalon  Perles and promethazine  cough medication without relief.  Sore throat chills and bodyaches have resolved.    Past Medical History:  Diagnosis Date   Hypertension     There are no active problems to display for this patient.   Past Surgical History:  Procedure Laterality Date   ABDOMINAL HYSTERECTOMY     APPENDECTOMY     CHOLECYSTECTOMY     TUBAL LIGATION      OB History   No obstetric history on file.      Home Medications    Prior to Admission medications   Medication Sig Start Date End Date Taking? Authorizing Provider  azithromycin  (ZITHROMAX  Z-PAK) 250 MG tablet Take 2 tablets on day 1 then 1 tablet daily 08/04/23  Yes Tanis Burnley, DO  cetirizine (ZYRTEC) 10 MG tablet Take 10 mg by mouth daily.   Yes [provider]  chlorpheniramine-HYDROcodone (TUSSIONEX) 10-8 MG/5ML Take 5 mLs by mouth every 12 (twelve) hours as needed. 08/04/23  Yes Davante Gerke, DO  hydrochlorothiazide (HYDRODIURIL) 25 MG tablet Take 25 mg by mouth daily.   Yes [provider]  ipratropium (ATROVENT ) 0.06 % nasal spray Place 2 sprays into both nostrils 4 (four) times daily. 09/23/21  Yes Bernardino Ditch, NP  lansoprazole (PREVACID) 30 MG capsule Take 30 mg by mouth daily. 07/31/23  Yes [provider]  meclizine  (ANTIVERT ) 25 MG tablet Take 1 tablet (25 mg total) by mouth 3 (three) times daily as needed for dizziness.  01/27/23  Yes Cyrena Mylar, MD  metFORMIN (GLUCOPHAGE-XR) 500 MG 24 hr tablet Take by mouth. 03/11/23 03/10/24 Yes [provider]  Multiple Vitamins-Minerals (MULTIVITAMIN WITH MINERALS) tablet Take 1 tablet by mouth daily.   Yes [provider]  predniSONE  (STERAPRED UNI-PAK 21 TAB) 10 MG (21) TBPK tablet Take by mouth daily. Take 6 tabs by mouth daily for 1, then 5 tabs for 1 day, then 4 tabs for 1 day, then 3 tabs for 1 day, then 2 tabs for 1 day, then 1 tab for 1 day. 08/04/23  Yes Deshanda Molitor, DO  RABEprazole (ACIPHEX) 20 MG tablet Take by mouth. 06/16/23 06/15/24 Yes [provider]    Family History Family History  Problem Relation Age of Onset   Lung cancer Mother    Breast cancer Neg Hx     Social History Social History   Tobacco Use   Smoking status: Never   Smokeless tobacco: Never  Vaping Use   Vaping status: Never Used  Substance Use Topics   Alcohol use: Yes   Drug use: Never     Allergies   Penicillins   Review of Systems Review of Systems: negative unless otherwise stated in HPI.      Physical Exam Triage Vital Signs ED Triage Vitals  Encounter Vitals Group     BP 08/04/23 1422 124/79  Systolic BP Percentile --      Diastolic BP Percentile --      Pulse Rate 08/04/23 1422 97     Resp 08/04/23 1422 16     Temp 08/04/23 1422 99.3 F (37.4 C)     Temp Source 08/04/23 1422 Oral     SpO2 08/04/23 1422 94 %     Weight 08/04/23 1421 184 lb (83.5 kg)     Height 08/04/23 1421 5' 5 (1.651 m)     Head Circumference --      Peak Flow --      Pain Score 08/04/23 1424 9     Pain Loc --      Pain Education --      Exclude from Growth Chart --    No data found.  Updated Vital Signs BP 124/79 (BP Location: Left Arm)   Pulse 97   Temp 99.3 F (37.4 C) (Oral)   Resp 16   Ht 5' 5 (1.651 m)   Wt 83.5 kg   SpO2 94%   BMI 30.62 kg/m   Visual Acuity Right Eye Distance:   Left Eye Distance:   Bilateral Distance:     Right Eye Near:   Left Eye Near:    Bilateral Near:     Physical Exam GEN:     alert, non-toxic appearing female in no distress    EYES:   no scleral injection or discharge NECK:  normal ROM, no lymphadenopathy, no meningismus   RESP:  no increased work of breathing, expiratory wheezing, rhonchi with productive cough CVS:   regular rate and rhythm ABD:   Soft, nontender, nondistended, no guarding, no rebound, active bowel sounds throughout, no CVA tenderness Skin:   warm and dry    UC Treatments / Results  Labs (all labs ordered are listed, but only abnormal results are displayed) Labs Reviewed  URINALYSIS, W/ REFLEX TO CULTURE (INFECTION SUSPECTED) - Abnormal; Notable for the following components:      Result Value   APPearance HAZY (*)    Protein, ur 30 (*)    Bacteria, UA FEW (*)    All other components within normal limits    EKG   Radiology DG Chest 2 View Result Date: 08/04/2023 CLINICAL DATA:  Cough and right-sided chest pain for 4 days. EXAM: CHEST - 2 VIEW COMPARISON:  None Available. FINDINGS: The heart size and mediastinal contours are within normal limits. Both lungs are clear. The visualized skeletal structures are unremarkable. IMPRESSION: No active cardiopulmonary disease. Electronically Signed   By: Norleen DELENA Kil M.D.   On: 08/04/2023 16:09     Procedures Procedures (including critical care time)  Medications Ordered in UC Medications - No data to display  Initial Impression / Assessment and Plan / UC Course  I have reviewed the triage vital signs and the nursing notes.  Pertinent labs & imaging results that were available during my care of the patient were reviewed by me and considered in my medical decision making (see chart for details).       Pt is a 58 y.o. female who presents for 1-2 weeks of cough that is not improving.  Allison Hobbs is  afebrile here without recent antipyretics. Satting 94%on room air. Overall pt is  non-toxic appearing, well  hydrated, without respiratory distress. Pulmonary exam is remarkable for faint expiratory wheezing and rhonchi that clear with cough.  After shared decision making, we will pursue chest x-ray.Allison Hobbs  COVID  and influenza testing deferred due to  length of symptoms.   Chest xray personally reviewed by me without focal pneumonia, pleural effusion, cardiomegaly or pneumothorax. Patient aware the radiologist has not read her xray and is comfortable with the preliminary read by me. Will review radiologist read when available and call patient if a change in plan is warranted.  Pt agreeable to this plan prior to discharge.   Treat acute bronchitis with steroids and antibiotics as below.  Tussionex cough syrup given for cough and allow patient to rest.  Typical duration of symptoms discussed. Return and ED precautions given and patient voiced understanding.   Urinalysis obtained as she has some right flank pain urinary frequency.  Urinalysis not concerning for acute cystitis.  No hematuria to suggest kidney stone.  There was some slight proteinuria.  Discussed MDM, treatment plan and plan for follow-up with patient who agrees with plan.   Radiologist impression reviewed.  Final Clinical Impressions(s) / UC Diagnoses   Final diagnoses:  Acute cough  Right flank pain  Bronchitis     Discharge Instructions      Your chest x-ray did not show a pneumonia.  I suspect that you may have bronchitis.  I sent a antibiotic, cough medicine and steroid to your pharmacy.  Your urine sample was not concerning for urinary tract infection and did not have any blood concerning for kidney stone.      ED Prescriptions     Medication Sig Dispense Auth. Provider   chlorpheniramine-HYDROcodone (TUSSIONEX) 10-8 MG/5ML Take 5 mLs by mouth every 12 (twelve) hours as needed. 115 mL Ifeoluwa Beller, DO   azithromycin  (ZITHROMAX  Z-PAK) 250 MG tablet Take 2 tablets on day 1 then 1 tablet daily 6 tablet Polly Barner, DO    predniSONE  (STERAPRED UNI-PAK 21 TAB) 10 MG (21) TBPK tablet Take by mouth daily. Take 6 tabs by mouth daily for 1, then 5 tabs for 1 day, then 4 tabs for 1 day, then 3 tabs for 1 day, then 2 tabs for 1 day, then 1 tab for 1 day. 21 tablet Indira Sorenson, DO      I have reviewed the PDMP during this encounter.   Abrial Arrighi, DO 08/08/23 1750

## 2023-08-04 NOTE — Discharge Instructions (Addendum)
 Your chest x-ray did not show a pneumonia.  I suspect that you may have bronchitis.  I sent a antibiotic, cough medicine and steroid to your pharmacy.  Your urine sample was not concerning for urinary tract infection and did not have any blood concerning for kidney stone.

## 2023-10-23 ENCOUNTER — Encounter: Payer: Self-pay | Admitting: *Deleted

## 2023-10-23 ENCOUNTER — Ambulatory Visit
Admission: EM | Admit: 2023-10-23 | Discharge: 2023-10-23 | Disposition: A | Discharge status: Home / Self Care | Attending: Emergency Medicine | Admitting: Emergency Medicine

## 2023-10-23 DIAGNOSIS — L500 Allergic urticaria: Secondary | ICD-10-CM

## 2023-10-23 MED ORDER — PREDNISONE 10 MG (21) PO TBPK: ORAL_TABLET | ORAL | 0 refills | Status: DC

## 2023-10-23 MED ORDER — DEXAMETHASONE SODIUM PHOSPHATE 10 MG/ML IJ SOLN
10.0000 mg | Freq: Once | INTRAMUSCULAR | Status: AC
  Administered 2023-10-23: 10 mg via INTRAMUSCULAR

## 2023-10-23 NOTE — Discharge Instructions (Addendum)
 Take over-the-counter Allegra 180 mg daily or Zyrtec or Claritin 10 mg daily to help with your itching.  You can take over-the-counter Benadryl, 50 mg at bedtime, as needed for itching and sleep.  Take the prednisone pack according to the package instructions.  You will taken on tapering dose over a period of 6 days.  Take it with food and always take it first in the morning with breakfast.  Take over-the-counter Pepcid 20 mg twice daily to help with itching as well.  If you develop any swelling of your lips or tongue, tightness in your throat, or difficulty breathing you need to go to the ER for evaluation.

## 2023-10-23 NOTE — ED Triage Notes (Signed)
 Patient with hives to left shoulder, one single spot on back,started today, itchy.  No new soaps, detergents or other known triggers.  No airway symptoms

## 2023-10-23 NOTE — ED Provider Notes (Signed)
 [facility]    CSN: [id number] Arrival date & time: 10/23/23  1612      History   Chief Complaint Chief Complaint  Patient presents with   Urticaria    HPI Allison Hobbs is a 58 y.o. female.   HPI  Hypertension presents for evaluation of an itchy, red rash on her left shoulder that started just prior to arrival.  She has never had allergic reaction in the past.  She denies any swelling of her lips, tongue, tightness of throat, or shortness of breath.  No new personal hygiene products, laundry detergents, clothing, medications, foods, or supplements.  Past Medical History:  Diagnosis Date   Hypertension     There are no active problems to display for this patient.   Past Surgical History:  Procedure Laterality Date   ABDOMINAL HYSTERECTOMY     APPENDECTOMY     CHOLECYSTECTOMY     TUBAL LIGATION      OB History   No obstetric history on file.      Home Medications    Prior to Admission medications   Medication Sig Start Date End Date Taking? Authorizing Provider  predniSONE (STERAPRED UNI-PAK 21 TAB) 10 MG (21) TBPK tablet Take 6 tablets on day 1, 5 tablets day 2, 4 tablets day 3, 3 tablets day 4, 2 tablets day 5, 1 tablet day 6 10/23/23  Yes Becky Augusta, NP  cetirizine (ZYRTEC) 10 MG tablet Take 10 mg by mouth daily.    [provider]  hydrochlorothiazide (HYDRODIURIL) 25 MG tablet Take 25 mg by mouth daily.    [provider]  lansoprazole (PREVACID) 30 MG capsule Take 30 mg by mouth daily. 07/31/23   [provider]  meclizine (ANTIVERT) 25 MG tablet Take 1 tablet (25 mg total) by mouth 3 (three) times daily as needed for dizziness. 01/27/23   Pilar Jarvis, MD  Multiple Vitamins-Minerals (MULTIVITAMIN WITH MINERALS) tablet Take 1 tablet by mouth daily.    [provider]    Family History Family History  Problem Relation Age of Onset   Lung cancer Mother    Breast cancer Neg Hx     Social History Social  History   Tobacco Use   Smoking status: Never   Smokeless tobacco: Never  Vaping Use   Vaping status: Never Used  Substance Use Topics   Alcohol use: Yes    Comment: occassionally   Drug use: Never     Allergies   Penicillins   Review of Systems Review of Systems  HENT:  Negative for trouble swallowing and voice change.   Respiratory:  Negative for shortness of breath.   Skin:  Positive for color change and rash.     Physical Exam Triage Vital Signs ED Triage Vitals  Encounter Vitals Group     BP      Systolic BP Percentile      Diastolic BP Percentile      Pulse      Resp      Temp      Temp src      SpO2      Weight      Height      Head Circumference      Peak Flow      Pain Score      Pain Loc      Pain Education      Exclude from Growth Chart    No data found.  Updated Vital Signs BP  135/84 (BP Location: Left Arm)   Pulse 79   Temp 98 F (36.7 C) (Oral)   Resp 18   Ht 5\' 5"  (1.651 m)   Wt 184 lb (83.5 kg)   SpO2 98%   BMI 30.62 kg/m   Visual Acuity Right Eye Distance:   Left Eye Distance:   Bilateral Distance:    Right Eye Near:   Left Eye Near:    Bilateral Near:     Physical Exam Vitals and nursing note reviewed.  Constitutional:      Appearance: Normal appearance. She is not ill-appearing.  Cardiovascular:     Rate and Rhythm: Normal rate and regular rhythm.     Pulses: Normal pulses.     Heart sounds: Normal heart sounds. No murmur heard.    No friction rub. No gallop.  Pulmonary:     Effort: Pulmonary effort is normal.     Breath sounds: Normal breath sounds. No stridor. No wheezing, rhonchi or rales.  Skin:    General: Skin is warm and dry.     Capillary Refill: Capillary refill takes less than 2 seconds.     Findings: Erythema and rash present.  Neurological:     General: No focal deficit present.     Mental Status: She is alert and oriented to person, place, and time.      UC Treatments / Results  Labs (all  labs ordered are listed, but only abnormal results are displayed) Labs Reviewed - No data to display  EKG   Radiology No results found.  Procedures Procedures (including critical care time)  Medications Ordered in UC Medications  dexamethasone (DECADRON) injection 10 mg (has no administration in time range)    Initial Impression / Assessment and Plan / UC Course  I have reviewed the triage vital signs and the nursing notes.  Pertinent labs & imaging results that were available during my care of the patient were reviewed by me and considered in my medical decision making (see chart for details).   Patient is a pleasant, nontoxic-appearing 58 year old female presenting for evaluation of urticaria on her left shoulder as outlined HPI above.    She is seen image above, the urticaria is isolated to the left shoulder and clavicular region though patient does have a single lesion on her left low back.  She does not have any stridor when auscultating over the trachea and her lungs are" all fields.  She is able to speak in full sentence with dyspnea or tachypnea.  Etiology of the patient's urticaria is unclear as she has not had any new environmental exposures, no new personal hygiene products, foods, or medications.  I will treat patient for allergic urticaria with 10 mg of IM Decadron in clinic and have her start a prednisone taper tomorrow.  Also dual antihistamine therapy with Claritin, Zyrtec, or Allegra during the day, Benadryl at night, and Pepcid 20 mg twice daily.   Final Clinical Impressions(s) / UC Diagnoses   Final diagnoses:  Allergic urticaria     Discharge Instructions      Take over-the-counter Allegra 180 mg daily or Zyrtec or Claritin 10 mg daily to help with your itching.  You can take over-the-counter Benadryl, 50 mg at bedtime, as needed for itching and sleep.  Take the prednisone pack according to the package instructions.  You will taken on tapering dose over  a period of 6 days.  Take it with food and always take it first in the morning with breakfast.  Take over-the-counter Pepcid 20 mg twice daily to help with itching as well.  If you develop any swelling of your lips or tongue, tightness in your throat, or difficulty breathing you need to go to the ER for evaluation.      ED Prescriptions     Medication Sig Dispense Auth. Provider   predniSONE (STERAPRED UNI-PAK 21 TAB) 10 MG (21) TBPK tablet Take 6 tablets on day 1, 5 tablets day 2, 4 tablets day 3, 3 tablets day 4, 2 tablets day 5, 1 tablet day 6 21 tablet Becky Augusta, NP      PDMP not reviewed this encounter.   Becky Augusta, NP 10/23/23 [phone]

## 2023-12-15 ENCOUNTER — Other Ambulatory Visit: Payer: Self-pay | Admitting: Pediatrics

## 2023-12-15 DIAGNOSIS — Z1231 Encounter for screening mammogram for malignant neoplasm of breast: Secondary | ICD-10-CM

## 2024-01-06 ENCOUNTER — Ambulatory Visit
Admission: RE | Admit: 2024-01-06 | Discharge: 2024-01-06 | Disposition: A | Discharge status: Home / Self Care | Source: Ambulatory Visit | Attending: Pediatrics | Admitting: Pediatrics

## 2024-01-06 DIAGNOSIS — Z1231 Encounter for screening mammogram for malignant neoplasm of breast: Secondary | ICD-10-CM | POA: Diagnosis present

## 2024-01-09 ENCOUNTER — Ambulatory Visit
Admission: EM | Admit: 2024-01-09 | Discharge: 2024-01-09 | Disposition: A | Discharge status: Home / Self Care | Attending: Emergency Medicine | Admitting: Emergency Medicine

## 2024-01-09 DIAGNOSIS — J029 Acute pharyngitis, unspecified: Secondary | ICD-10-CM

## 2024-01-09 DIAGNOSIS — R051 Acute cough: Secondary | ICD-10-CM

## 2024-01-09 DIAGNOSIS — J40 Bronchitis, not specified as acute or chronic: Secondary | ICD-10-CM | POA: Diagnosis not present

## 2024-01-09 MED ORDER — AZITHROMYCIN 250 MG PO TABS: ORAL_TABLET | ORAL | 0 refills | Status: AC

## 2024-01-09 MED ORDER — PROMETHAZINE-DM 6.25-15 MG/5ML PO SYRP: 5.0000 mL | ORAL_SOLUTION | Freq: Four times a day (QID) | ORAL | 0 refills | Status: AC | PRN

## 2024-01-09 MED ORDER — PREDNISONE 10 MG (21) PO TBPK: ORAL_TABLET | ORAL | 0 refills | Status: AC

## 2024-01-09 NOTE — Discharge Instructions (Signed)
 Take meds as prescribed(prednisone , phenergan , zpak) Follow up with PCP Return as needed

## 2024-01-09 NOTE — ED Triage Notes (Signed)
 Pt c/o cough, congestion x1week  Pt has been taking robitussin and states the last time she was here she was given steroids and hydrocodone cough syrup.   Pt declines a strep test.

## 2024-01-09 NOTE — ED Provider Notes (Signed)
 [facility]    CSN: [id number] Arrival date & time: 01/09/24  1224      History   Chief Complaint Chief Complaint  Patient presents with   Cough   Sore Throat    HPI SHAHARA HARTSFIELD is a 58 y.o. female.   58 year old female , Jawanda Passey , presents to urgent care for evaluation of cough and sore throat x 1 week , patient states she had the same symptoms approximately few months back , pt tried robitussin and delsym without relief.  Patient requesting hydrocodone cough syrup she received this last time and it helped.  Patient denies smoking or vaping.  No known illness exposure.  Patient declined strep test, states sore throat is from coughing so much.   The history is provided by the patient. No language interpreter was used.    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      Home Medications    Prior to Admission medications   Medication Sig Start Date End Date Taking? Authorizing Provider  azithromycin  (ZITHROMAX  Z-PAK) 250 MG tablet Take as package directed 01/09/24  Yes Elgie Maziarz, NP  cetirizine (ZYRTEC) 10 MG tablet Take 10 mg by mouth daily.   Yes [provider]  hydrochlorothiazide (HYDRODIURIL) 25 MG tablet Take 25 mg by mouth daily.   Yes [provider]  lansoprazole (PREVACID) 30 MG capsule Take 30 mg by mouth daily. 07/31/23  Yes [provider]  meclizine  (ANTIVERT ) 25 MG tablet Take 1 tablet (25 mg total) by mouth 3 (three) times daily as needed for dizziness. 01/27/23   Yes Buell Carmin, MD  Multiple Vitamins-Minerals (MULTIVITAMIN WITH MINERALS) tablet Take 1 tablet by mouth daily.   Yes [provider]  promethazine -dextromethorphan (PROMETHAZINE -DM) 6.25-15 MG/5ML syrup Take 5 mLs by mouth 4 (four) times daily as needed for cough. 01/09/24  Yes Tylerjames Hoglund, Eveleen Hinds, NP  predniSONE  (STERAPRED UNI-PAK 21 TAB) 10 MG (21) TBPK tablet Take 6 tablets on day 1, 5 tablets day 2, 4 tablets day 3, 3 tablets day 4, 2 tablets day 5, 1 tablet day 6 01/09/24   Sharalyn Lomba, Eveleen Hinds, NP    Family History Family History  Problem Relation Age of Onset   Lung cancer Mother    Breast cancer Neg Hx     Social History Social History   Tobacco Use   Smoking status: Never   Smokeless tobacco: Never  Vaping Use   Vaping status: Never Used  Substance Use Topics   Alcohol use: Yes    Comment: occassionally   Drug use: Never     Allergies   Penicillins   Review of Systems Review of Systems  Constitutional:  Negative for fever.  HENT:  Positive for sore throat.   Respiratory:  Positive for cough. Negative for shortness of breath, wheezing and stridor.   Cardiovascular:  Negative for chest pain and palpitations.  All other systems reviewed and are negative.    Physical Exam Triage Vital  Signs ED Triage Vitals  Encounter Vitals Group     BP 01/09/24 1235 127/86     Girls Systolic BP Percentile --      Girls Diastolic BP Percentile --      Boys Systolic BP Percentile --      Boys Diastolic BP Percentile --      Pulse Rate 01/09/24 1235 85     Resp --      Temp 01/09/24 1235 98.5 F (36.9 C)     Temp Source 01/09/24 1235 Oral     SpO2 01/09/24 1235 97 %     Weight 01/09/24 1233 189 lb (85.7 kg)     Height 01/09/24 1233 5' 5 (1.651 m)     Head Circumference --      Peak Flow --      Pain Score 01/09/24 1233 8     Pain Loc --      Pain Education --      Exclude from Growth Chart --    No data found.  Updated Vital Signs BP 127/86 (BP  Location: Left Arm)   Pulse 85   Temp 98.5 F (36.9 C) (Oral)   Ht 5' 5 (1.651 m)   Wt 189 lb (85.7 kg)   SpO2 97%   BMI 31.45 kg/m   Visual Acuity Right Eye Distance:   Left Eye Distance:   Bilateral Distance:    Right Eye Near:   Left Eye Near:    Bilateral Near:     Physical Exam Vitals and nursing note reviewed.  Constitutional:      General: She is not in acute distress.    Appearance: She is well-developed.  HENT:     Head: Normocephalic.     Right Ear: Tympanic membrane is retracted.     Left Ear: Tympanic membrane is retracted.     Nose: Congestion present.     Mouth/Throat:     Lips: Pink.     Mouth: Mucous membranes are moist.     Pharynx: Oropharynx is clear.   Eyes:     General: Lids are normal.     Conjunctiva/sclera: Conjunctivae normal.     Pupils: Pupils are equal, round, and reactive to light.   Neck:     Trachea: No tracheal deviation.   Cardiovascular:     Rate and Rhythm: Normal rate and regular rhythm.     Heart sounds: Normal heart sounds. No murmur heard. Pulmonary:     Effort: Pulmonary effort is normal.     Breath sounds: Normal breath sounds and air entry.     Comments: R16 unlabored,sat 97% on RA Abdominal:     General: Bowel sounds are normal.     Palpations: Abdomen is soft.     Tenderness: There is no abdominal tenderness.   Musculoskeletal:        General: Normal range of motion.     Cervical back: Normal range of motion.  Lymphadenopathy:     Cervical: No cervical adenopathy.   Skin:    General: Skin is warm and dry.     Findings: No rash.   Neurological:     General: No focal deficit present.     Mental Status: She is alert and oriented to person, place, and time.     GCS: GCS eye subscore is 4. GCS verbal subscore is 5. GCS motor subscore is 6.   Psychiatric:        Speech: Speech normal.  Behavior: Behavior normal. Behavior is cooperative.      UC Treatments / Results  Labs (all labs ordered are  listed, but only abnormal results are displayed) Labs Reviewed - No data to display  EKG   Radiology No results found.  Procedures Procedures (including critical care time)  Medications Ordered in UC Medications - No data to display  Initial Impression / Assessment and Plan / UC Course  I have reviewed the triage vital signs and the nursing notes.  Pertinent labs & imaging results that were available during my care of the patient were reviewed by me and considered in my medical decision making (see chart for details).    Discussed exam findings and plan of care with patient, scripted azithromycin  , prednisone  , and Phenergan  DM , strict go to ER precautions given.   Patient verbalized understanding to this provider.  Ddx: Bronchitis, acute cough, sore throat, viral illness, allergies Final Clinical Impressions(s) / UC Diagnoses   Final diagnoses:  Bronchitis  Acute cough  Sore throat     Discharge Instructions      Take meds as prescribed(prednisone , phenergan , zpak) Follow up with PCP Return as needed     ED Prescriptions     Medication Sig Dispense Auth. Provider   predniSONE  (STERAPRED UNI-PAK 21 TAB) 10 MG (21) TBPK tablet Take 6 tablets on day 1, 5 tablets day 2, 4 tablets day 3, 3 tablets day 4, 2 tablets day 5, 1 tablet day 6 21 tablet Amrit Erck, NP   azithromycin  (ZITHROMAX  Z-PAK) 250 MG tablet Take as package directed 6 tablet Georgio Hattabaugh, NP   promethazine -dextromethorphan (PROMETHAZINE -DM) 6.25-15 MG/5ML syrup Take 5 mLs by mouth 4 (four) times daily as needed for cough. 118 mL Alfonzo Arca, NP      PDMP not reviewed this encounter.   Peter Brands, NP 01/09/24 2043

## 2024-01-19 ENCOUNTER — Encounter: Payer: Self-pay | Admitting: Emergency Medicine

## 2024-01-19 ENCOUNTER — Ambulatory Visit: Admission: EM | Admit: 2024-01-19 | Discharge: 2024-01-19 | Disposition: A | Discharge status: Home / Self Care

## 2024-01-19 DIAGNOSIS — R519 Headache, unspecified: Secondary | ICD-10-CM | POA: Diagnosis not present

## 2024-01-19 DIAGNOSIS — R110 Nausea: Secondary | ICD-10-CM

## 2024-01-19 MED ORDER — ONDANSETRON 8 MG PO TBDP: 8.0000 mg | ORAL_TABLET | Freq: Three times a day (TID) | ORAL | 0 refills | Status: AC | PRN

## 2024-01-19 NOTE — ED Triage Notes (Signed)
 Pt presents with a headache and nausea for over 1 week. 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 She went back to work last week and that's when he symptoms became worse. She has a PCP appointment in 4 days.

## 2024-01-19 NOTE — ED Provider Notes (Signed)
 [facility]    CSN: [id number] Arrival date & time: 01/19/24  1417      History   Chief Complaint Chief Complaint  Patient presents with   Headache   Nausea    HPI Allison Hobbs is a 58 y.o. female.   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  She reports that she has had both the symptoms since her surgery but they have become worse over the last week since returning back to work.  She reports that she wakes up with a dry mouth and dry lips and she thinks she may be running intermittent fevers though she has not measured a fever.  She also reports that she has had decreased urine output despite increasing her oral fluid intake.  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      Home Medications    Prior to Admission medications   Medication Sig Start Date End Date Taking? Authorizing Provider  acetaminophen (TYLENOL) 325 MG tablet Take 975 mg by mouth. 11/28/23  Yes [provider]  busPIRone (BUSPAR) 5 MG tablet Take 5 mg by mouth. 10/20/23 10/19/24 Yes [provider]  metFORMIN (GLUCOPHAGE-XR) 500 MG 24 hr tablet Take 1 tablet by mouth 2 (two) times daily. 12/30/23 12/29/24 Yes [provider]  ondansetron  (ZOFRAN -ODT) 8 MG disintegrating tablet Take 1 tablet (8 mg total) by mouth every 8 (eight) hours as needed for nausea or vomiting. 01/19/24  Yes Bernardino Ditch, NP  azithromycin  (ZITHROMAX  Z-PAK) 250 MG tablet Take as package directed 01/09/24   Defelice, Rilla, NP  B Complex Vitamins (VITAMIN B COMPLEX) TABS Take 1 tablet by mouth daily.    [provider]  cetirizine (ZYRTEC) 10 MG tablet Take 10 mg by mouth daily.    [provider]  hydrochlorothiazide (HYDRODIURIL) 25 MG tablet Take 25 mg by mouth daily.    [provider]  lansoprazole (PREVACID) 30 MG capsule Take 30 mg by mouth daily. 07/31/23   [provider]  meclizine  (ANTIVERT ) 25 MG tablet Take 1 tablet (25 mg total) by mouth 3 (three) times daily as needed for dizziness. 01/27/23   Cyrena Mylar, MD  Multiple Vitamins-Minerals (MULTIVITAMIN WITH MINERALS) tablet Take 1 tablet by mouth daily.    [provider]  predniSONE  (STERAPRED UNI-PAK 21 TAB) 10 MG (21) TBPK tablet Take 6 tablets on day 1, 5 tablets day 2, 4 tablets day 3, 3 tablets day 4, 2 tablets day 5, 1 tablet day 6 01/09/24   Defelice, Jeanette, NP  promethazine -dextromethorphan (PROMETHAZINE -DM) 6.25-15 MG/5ML syrup Take 5 mLs by mouth 4 (four) times daily as needed for cough. 01/09/24   Defelice, Rilla, NP    Family History Family History  Problem Relation Age of Onset   Lung cancer Mother    Breast cancer Neg Hx     Social  History Social History   Tobacco Use   Smoking status: Never   Smokeless tobacco: Never  Vaping Use   Vaping status: Never Used  Substance Use Topics   Alcohol use: Yes    Comment: occassionally   Drug use: Never     Allergies   Penicillins   Review of Systems Review of Systems  Gastrointestinal:  Positive for nausea.  Neurological:  Positive for headaches.     Physical Exam Triage Vital Signs ED Triage Vitals [01/19/24 1435]  Encounter Vitals Group     BP      Girls Systolic BP Percentile      Girls Diastolic BP Percentile      Boys Systolic BP Percentile      Boys Diastolic BP Percentile      Pulse      Resp      Temp       Temp src      SpO2      Weight      Height      Head Circumference      Peak Flow      Pain Score 6     Pain Loc      Pain Education      Exclude from Growth Chart    No data found.  Updated Vital Signs BP 127/85 (BP Location: Right Arm)   Pulse 83   Temp 99 F (37.2 C) (Oral)   Resp 16   SpO2 98%   Visual Acuity Right Eye Distance:   Left Eye Distance:   Bilateral Distance:    Right Eye Near:   Left Eye Near:    Bilateral Near:     Physical Exam Vitals and nursing note reviewed.  Constitutional:      Appearance: Normal appearance. She is not ill-appearing.  HENT:     Head: Normocephalic and atraumatic.     Nose: Nose normal. No congestion or rhinorrhea.     Comments: No rhinorrhea noted.    Mouth/Throat:     Mouth: Mucous membranes are moist.     Pharynx: Oropharynx is clear. No oropharyngeal exudate or posterior oropharyngeal erythema.   Cardiovascular:     Rate and Rhythm: Normal rate and regular rhythm.     Pulses: Normal pulses.     Heart sounds: Normal heart sounds. No murmur heard.    No friction rub. No gallop.  Pulmonary:     Effort: Pulmonary effort is normal.     Breath sounds: Normal breath sounds. No wheezing, rhonchi or rales.   Skin:    General: Skin is warm and dry.     Capillary Refill: Capillary refill takes less than 2 seconds.     Findings: No rash.   Neurological:     General: No focal deficit present.     Mental Status: She is alert and oriented to person, place, and time.      UC Treatments / Results  Labs (all labs ordered are listed, but only abnormal results are displayed) Labs Reviewed - No data to display  EKG   Radiology No results found.  Procedures Procedures (including critical care time)  Medications Ordered in UC Medications - No data to display  Initial Impression / Assessment and Plan / UC Course  I have reviewed the triage vital signs and the nursing notes.  Pertinent labs & imaging results  that were available during my care of the patient were reviewed by me and considered in my medical decision  making (see chart for details).   Patient is a pleasant, nontoxic-appearing 58 year old female presenting for evaluation of headache and nausea as outlined in HPI above.  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  She was seen by [facility] endocrinology on 12/30/2023 and at that time she was experiencing headaches, increased thirst, polyuria, and had a few pounds weight gain.  She was seen again by the [facility] brain tumor clinic on 01/07/2024 and was continued to experience mild headaches that were controlled by Tylenol and she was doing her nasal rinses as prescribed by ENT.  At that time she was experiencing diaphoresis and fatigue, nasal congestion, sore throat, and voice changes, and she was having visual disturbances.  She had labs ordered by [facility] endocrinology on 01/14/2024 which she had drawn today which include blood osmolality, random urine osmolality, and BMP.  Those values are not available to me in epic.  Patient's vital signs are reassuring though she does have a mildly elevated temp of 99.  I have advised her that she needs to follow-up with her specialist at [facility] and reach out to them about her ongoing symptoms.  I will write her for Zofran  that she can use for nausea and we will write her a work note to be out until she follows up with her primary care provider in 3 days.  I have advised her that if she has any worsening headache, nausea and vomiting where she cannot keep down fluids or medications, or she spikes a fever she needs to go to the ER for evaluation.   Final Clinical Impressions(s) / UC Diagnoses   Final diagnoses:  Nonintractable episodic headache, unspecified headache type  Nausea     Discharge Instructions      Use the Zofran  every 8 hours as needed for nausea and vomiting.  Continue to use over-the-counter Tylenol according to the  package instructions as needed for your headache pain, as you are clinic notes from [facility] indicate that this has been helpful for you.  Reach out to your brain tumor clinic and endocrinologist at [facility] to discuss your ongoing symptoms and to discuss the results of your lab work you had obtained today.  If you spike a fever of 100.4 or higher, have nausea and vomiting or you cannot keep down fluids or medications, or worsening of your headache that is not controlled by Tylenol you need to go to the emergency department for evaluation.     ED Prescriptions     Medication Sig Dispense Auth. Provider   ondansetron  (ZOFRAN -ODT) 8 MG disintegrating tablet Take 1 tablet (8 mg total) by mouth every 8 (eight) hours as needed for nausea or vomiting. 20 tablet Bernardino Ditch, NP      PDMP not reviewed this encounter.   Bernardino Ditch, NP 01/19/24 [phone]

## 2024-01-19 NOTE — Discharge Instructions (Signed)
 Use the Zofran  every 8 hours as needed for nausea and vomiting.  Continue to use over-the-counter Tylenol according to the package instructions as needed for your headache pain, as you are clinic notes from [facility] indicate that this has been helpful for you.  Reach out to your brain tumor clinic and endocrinologist at [facility] to discuss your ongoing symptoms and to discuss the results of your lab work you had obtained today.  If you spike a fever of 100.4 or higher, have nausea and vomiting or you cannot keep down fluids or medications, or worsening of your headache that is not controlled by Tylenol you need to go to the emergency department for evaluation.
